# Patient Record
Sex: Female | Born: 1935
Health system: Southern US, Community
[De-identification: ages and names within clinical notes are randomized; demographics above are authoritative.]

## PROBLEM LIST (undated history)

## (undated) DIAGNOSIS — M48061 Spinal stenosis, lumbar region without neurogenic claudication: Secondary | ICD-10-CM

## (undated) DIAGNOSIS — K219 Gastro-esophageal reflux disease without esophagitis: Secondary | ICD-10-CM

## (undated) DIAGNOSIS — D500 Iron deficiency anemia secondary to blood loss (chronic): Secondary | ICD-10-CM

## (undated) DIAGNOSIS — E7800 Pure hypercholesterolemia, unspecified: Secondary | ICD-10-CM

## (undated) DIAGNOSIS — E559 Vitamin D deficiency, unspecified: Secondary | ICD-10-CM

## (undated) DIAGNOSIS — I10 Essential (primary) hypertension: Secondary | ICD-10-CM

## (undated) DIAGNOSIS — R252 Cramp and spasm: Secondary | ICD-10-CM

## (undated) DIAGNOSIS — G510 Bell's palsy: Secondary | ICD-10-CM

## (undated) DIAGNOSIS — H35039 Hypertensive retinopathy, unspecified eye: Secondary | ICD-10-CM

## (undated) DIAGNOSIS — K635 Polyp of colon: Secondary | ICD-10-CM

## (undated) DIAGNOSIS — N189 Chronic kidney disease, unspecified: Secondary | ICD-10-CM

## (undated) HISTORY — PX: APPENDECTOMY: SHX54

## (undated) HISTORY — DX: Spinal stenosis, lumbar region without neurogenic claudication: M48.061

## (undated) HISTORY — DX: Iron deficiency anemia secondary to blood loss (chronic): D50.0

## (undated) HISTORY — DX: Bell's palsy: G51.0

## (undated) HISTORY — PX: ABDOMINAL HYSTERECTOMY: SHX81

## (undated) HISTORY — DX: Vitamin D deficiency, unspecified: E55.9

## (undated) HISTORY — DX: Gastro-esophageal reflux disease without esophagitis: K21.9

## (undated) HISTORY — DX: Chronic kidney disease, unspecified: N18.9

## (undated) HISTORY — DX: Pure hypercholesterolemia, unspecified: E78.00

## (undated) HISTORY — DX: Hypertensive retinopathy, unspecified eye: H35.039

## (undated) HISTORY — DX: Polyp of colon: K63.5

---

## 2015-12-26 DIAGNOSIS — R5381 Other malaise: Secondary | ICD-10-CM | POA: Diagnosis not present

## 2015-12-26 DIAGNOSIS — E559 Vitamin D deficiency, unspecified: Secondary | ICD-10-CM | POA: Diagnosis not present

## 2015-12-26 DIAGNOSIS — E7800 Pure hypercholesterolemia, unspecified: Secondary | ICD-10-CM | POA: Diagnosis not present

## 2015-12-26 DIAGNOSIS — I10 Essential (primary) hypertension: Secondary | ICD-10-CM | POA: Diagnosis not present

## 2016-01-03 DIAGNOSIS — R9431 Abnormal electrocardiogram [ECG] [EKG]: Secondary | ICD-10-CM | POA: Diagnosis not present

## 2016-01-03 DIAGNOSIS — E559 Vitamin D deficiency, unspecified: Secondary | ICD-10-CM | POA: Diagnosis not present

## 2016-01-03 DIAGNOSIS — E7800 Pure hypercholesterolemia, unspecified: Secondary | ICD-10-CM | POA: Diagnosis not present

## 2016-01-03 DIAGNOSIS — K219 Gastro-esophageal reflux disease without esophagitis: Secondary | ICD-10-CM | POA: Diagnosis not present

## 2016-01-03 DIAGNOSIS — Z0000 Encounter for general adult medical examination without abnormal findings: Secondary | ICD-10-CM | POA: Diagnosis not present

## 2016-01-03 DIAGNOSIS — I10 Essential (primary) hypertension: Secondary | ICD-10-CM | POA: Diagnosis not present

## 2016-01-03 DIAGNOSIS — R5381 Other malaise: Secondary | ICD-10-CM | POA: Diagnosis not present

## 2016-01-03 DIAGNOSIS — I447 Left bundle-branch block, unspecified: Secondary | ICD-10-CM | POA: Diagnosis not present

## 2016-01-03 DIAGNOSIS — N6029 Fibroadenosis of unspecified breast: Secondary | ICD-10-CM | POA: Diagnosis not present

## 2016-12-28 DIAGNOSIS — E7800 Pure hypercholesterolemia, unspecified: Secondary | ICD-10-CM | POA: Diagnosis not present

## 2016-12-28 DIAGNOSIS — I10 Essential (primary) hypertension: Secondary | ICD-10-CM | POA: Diagnosis not present

## 2016-12-28 DIAGNOSIS — R5381 Other malaise: Secondary | ICD-10-CM | POA: Diagnosis not present

## 2016-12-28 DIAGNOSIS — E559 Vitamin D deficiency, unspecified: Secondary | ICD-10-CM | POA: Diagnosis not present

## 2017-01-04 DIAGNOSIS — N6029 Fibroadenosis of unspecified breast: Secondary | ICD-10-CM | POA: Diagnosis not present

## 2017-01-04 DIAGNOSIS — E559 Vitamin D deficiency, unspecified: Secondary | ICD-10-CM | POA: Diagnosis not present

## 2017-01-04 DIAGNOSIS — M5416 Radiculopathy, lumbar region: Secondary | ICD-10-CM | POA: Diagnosis not present

## 2017-01-04 DIAGNOSIS — R9431 Abnormal electrocardiogram [ECG] [EKG]: Secondary | ICD-10-CM | POA: Diagnosis not present

## 2017-01-04 DIAGNOSIS — E7800 Pure hypercholesterolemia, unspecified: Secondary | ICD-10-CM | POA: Diagnosis not present

## 2017-01-04 DIAGNOSIS — I10 Essential (primary) hypertension: Secondary | ICD-10-CM | POA: Diagnosis not present

## 2017-01-04 DIAGNOSIS — I447 Left bundle-branch block, unspecified: Secondary | ICD-10-CM | POA: Diagnosis not present

## 2017-01-04 DIAGNOSIS — D649 Anemia, unspecified: Secondary | ICD-10-CM | POA: Diagnosis not present

## 2017-01-04 DIAGNOSIS — Z0000 Encounter for general adult medical examination without abnormal findings: Secondary | ICD-10-CM | POA: Diagnosis not present

## 2017-01-04 DIAGNOSIS — K219 Gastro-esophageal reflux disease without esophagitis: Secondary | ICD-10-CM | POA: Diagnosis not present

## 2017-01-05 DIAGNOSIS — D649 Anemia, unspecified: Secondary | ICD-10-CM | POA: Diagnosis not present

## 2017-01-23 ENCOUNTER — Other Ambulatory Visit: Payer: Self-pay | Admitting: Internal Medicine

## 2017-01-23 DIAGNOSIS — M5416 Radiculopathy, lumbar region: Secondary | ICD-10-CM

## 2017-02-05 ENCOUNTER — Ambulatory Visit
Admission: RE | Admit: 2017-02-05 | Discharge: 2017-02-05 | Disposition: A | Discharge status: Home / Self Care | Payer: Medicare Other | Source: Ambulatory Visit | Attending: Internal Medicine | Admitting: Internal Medicine

## 2017-02-05 DIAGNOSIS — M5416 Radiculopathy, lumbar region: Secondary | ICD-10-CM

## 2017-02-05 DIAGNOSIS — M48061 Spinal stenosis, lumbar region without neurogenic claudication: Secondary | ICD-10-CM | POA: Diagnosis not present

## 2017-02-07 ENCOUNTER — Emergency Department (HOSPITAL_BASED_OUTPATIENT_CLINIC_OR_DEPARTMENT_OTHER): Payer: Medicare Other

## 2017-02-07 ENCOUNTER — Emergency Department (HOSPITAL_BASED_OUTPATIENT_CLINIC_OR_DEPARTMENT_OTHER)
Admission: EM | Admit: 2017-02-07 | Discharge: 2017-02-07 | Disposition: A | Discharge status: Home / Self Care | Payer: Medicare Other | Attending: Emergency Medicine | Admitting: Emergency Medicine

## 2017-02-07 ENCOUNTER — Encounter (HOSPITAL_BASED_OUTPATIENT_CLINIC_OR_DEPARTMENT_OTHER): Payer: Self-pay | Admitting: Emergency Medicine

## 2017-02-07 DIAGNOSIS — I10 Essential (primary) hypertension: Secondary | ICD-10-CM | POA: Diagnosis not present

## 2017-02-07 DIAGNOSIS — Z79899 Other long term (current) drug therapy: Secondary | ICD-10-CM | POA: Insufficient documentation

## 2017-02-07 DIAGNOSIS — G510 Bell's palsy: Secondary | ICD-10-CM | POA: Diagnosis not present

## 2017-02-07 DIAGNOSIS — R29810 Facial weakness: Secondary | ICD-10-CM | POA: Diagnosis present

## 2017-02-07 DIAGNOSIS — R200 Anesthesia of skin: Secondary | ICD-10-CM | POA: Diagnosis not present

## 2017-02-07 HISTORY — DX: Cramp and spasm: R25.2

## 2017-02-07 HISTORY — DX: Essential (primary) hypertension: I10

## 2017-02-07 HISTORY — DX: Pure hypercholesterolemia, unspecified: E78.00

## 2017-02-07 LAB — CBC WITH DIFFERENTIAL/PLATELET
BASOS ABS: 0.1 10*3/uL (ref 0.0–0.1)
BASOS PCT: 1 %
EOS PCT: 2 %
Eosinophils Absolute: 0.1 10*3/uL (ref 0.0–0.7)
HCT: 35.6 % — ABNORMAL LOW (ref 36.0–46.0)
Hemoglobin: 11.9 g/dL — ABNORMAL LOW (ref 12.0–15.0)
Lymphocytes Relative: 19 %
Lymphs Abs: 1.3 10*3/uL (ref 0.7–4.0)
MCH: 30.7 pg (ref 26.0–34.0)
MCHC: 33.4 g/dL (ref 30.0–36.0)
MCV: 92 fL (ref 78.0–100.0)
Monocytes Absolute: 0.6 10*3/uL (ref 0.1–1.0)
Monocytes Relative: 8 %
Neutro Abs: 4.8 10*3/uL (ref 1.7–7.7)
Neutrophils Relative %: 70 %
PLATELETS: 305 10*3/uL (ref 150–400)
RBC: 3.87 MIL/uL (ref 3.87–5.11)
RDW: 12.2 % (ref 11.5–15.5)
WBC: 6.8 10*3/uL (ref 4.0–10.5)

## 2017-02-07 LAB — BASIC METABOLIC PANEL
Anion gap: 11 (ref 5–15)
BUN: 23 mg/dL — AB (ref 6–20)
CALCIUM: 9.1 mg/dL (ref 8.9–10.3)
CO2: 23 mmol/L (ref 22–32)
Chloride: 101 mmol/L (ref 101–111)
Creatinine, Ser: 1.01 mg/dL — ABNORMAL HIGH (ref 0.44–1.00)
GFR calc Af Amer: 60 mL/min — ABNORMAL LOW (ref >60)
GFR, EST NON AFRICAN AMERICAN: 52 mL/min — AB (ref >60)
Glucose, Bld: 103 mg/dL — ABNORMAL HIGH (ref 65–99)
Potassium: 4 mmol/L (ref 3.5–5.1)
Sodium: 135 mmol/L (ref 135–145)

## 2017-02-07 MED ORDER — VALACYCLOVIR HCL 1 G PO TABS: 1000.0000 mg | ORAL_TABLET | Freq: Three times a day (TID) | ORAL | 0 refills | Status: DC

## 2017-02-07 MED ORDER — PREDNISONE 10 MG PO TABS: 20.0000 mg | ORAL_TABLET | Freq: Two times a day (BID) | ORAL | 0 refills | Status: DC

## 2017-02-07 MED FILL — valACYclovir HCL 1 GM TABS: 1 | 7 days supply | Qty: 21 | Fill #0

## 2017-02-07 MED FILL — predniSONE 10 MG TABS: 10 | 5 days supply | Qty: 20 | Fill #0

## 2017-02-07 NOTE — ED Provider Notes (Signed)
[facility] Provider Note   CSN: [id number] Arrival date & time: 02/07/17  1219     History   Chief Complaint Chief Complaint  Patient presents with  . Facial Droop    HPI Claire Jensen is a 82 y.o. female.  Patient is a 82 year old female with past medical history of hypertension. She presents today for evaluation of right facial weakness and numbness that started sometime last night. It was present upon waking sleep this morning. She reports trying to brush her teeth and liquids bowing out of the corner mouth. She is also having trouble closing her eye. She denies any weakness or numbness of her arm or leg.   The history is provided by the patient.  Weakness  Primary symptoms include focal weakness. This is a new problem. Episode onset: Last night. The problem has been gradually improving. There was right facial focality noted. There has been no fever. Pertinent negatives include no shortness of breath, no chest pain and no vomiting.    Past Medical History:  Diagnosis Date  . Hypercholesteremia   . Hypertension   . Leg cramps     There are no active problems to display for this patient.   Past Surgical History:  Procedure Laterality Date  . ABDOMINAL HYSTERECTOMY    . APPENDECTOMY      OB History    No data available       Home Medications    Prior to Admission medications   Medication Sig Start Date End Date Taking? Authorizing Provider  atorvastatin (LIPITOR) 20 MG tablet Take 20 mg by mouth daily.   Yes [provider]  lisinopril (PRINIVIL,ZESTRIL) 10 MG tablet Take 10 mg by mouth daily.   Yes [provider]  magnesium 30 MG tablet Take 3 mg by mouth 2 (two) times daily.   Yes [provider]    Family History History reviewed. No pertinent family history.  Social History Social History  Substance Use Topics  . Smoking status: Never Smoker  . Smokeless tobacco: Never Used  . Alcohol use 0.6 oz/week    1  Shots of liquor per week     Allergies   Patient has no known allergies.   Review of Systems Review of Systems  Respiratory: Negative for shortness of breath.   Cardiovascular: Negative for chest pain.  Gastrointestinal: Negative for vomiting.  Neurological: Positive for focal weakness and weakness.  All other systems reviewed and are negative.    Physical Exam Updated Vital Signs BP (!) 189/88 (BP Location: Right Arm)   Pulse 99   Temp 98 F (36.7 C) (Oral)   Resp 18   Ht 5\' 2"  (1.575 m)   Wt 74.4 kg (164 lb)   SpO2 99%   BMI 30.00 kg/m   Physical Exam  Constitutional: She is oriented to person, place, and time. She appears well-developed and well-nourished. No distress.  HENT:  Head: Normocephalic and atraumatic.  Mouth/Throat: Oropharynx is clear and moist.  Neck: Normal range of motion. Neck supple.  Cardiovascular: Normal rate and regular rhythm.  Exam reveals no gallop and no friction rub.   No murmur heard. Pulmonary/Chest: Effort normal and breath sounds normal. No respiratory distress. She has no wheezes.  Abdominal: Soft. Bowel sounds are normal. She exhibits no distension. There is no tenderness.  Musculoskeletal: Normal range of motion.  Neurological: She is alert and oriented to person, place, and time. A cranial nerve deficit is present. She exhibits normal muscle tone. Coordination  normal.  There is a seventh cranial nerve palsy present. There is facial droop with temporal involvement and asymmetrical closing of the eyelids.  Skin: Skin is warm and dry. She is not diaphoretic.  Nursing note and vitals reviewed.    ED Treatments / Results  Labs (all labs ordered are listed, but only abnormal results are displayed) Labs Reviewed  BASIC METABOLIC PANEL  CBC WITH DIFFERENTIAL/PLATELET    EKG  EKG Interpretation  Date/Time:  Thursday Feb 07 2017 12:36:20 EDT Ventricular Rate:  87 PR Interval:    QRS Duration: 140 QT Interval:  382 QTC  Calculation: 460 R Axis:   -6 Text Interpretation:  Sinus rhythm Left bundle branch block Confirmed by Geoffery Lyons ([id number]) on 02/07/2017 1:44:34 PM       Radiology No results found.  Procedures Procedures (including critical care time)  Medications Ordered in ED Medications - No data to display   Initial Impression / Assessment and Plan / ED Course  I have reviewed the triage vital signs and the nursing notes.  Pertinent labs & imaging results that were available during my care of the patient were reviewed by me and considered in my medical decision making (see chart for details).  Patient presents with right-sided facial droop. Her exam and workup is most consistent with a Bell's palsy. There is involvement of the eyelid and musculature of the forehead. CT scan is negative and laboratory studies are normal. She will be discharged with prednisone, Valtrex, and follow-up with primary doctor.  Final Clinical Impressions(s) / ED Diagnoses   Final diagnoses:  None    New Prescriptions New Prescriptions   No medications on file     Geoffery Lyons, MD 02/07/17 1407

## 2017-02-07 NOTE — Discharge Instructions (Signed)
Prednisone and Valtrex as prescribed.  Follow-up with your primary Dr. in the next week for recheck, and return to the emergency department in the meantime if symptoms significantly worsen or change.

## 2017-02-07 NOTE — ED Triage Notes (Signed)
Patient states that she woke up this am and her right eye was draing and hard to close. The patient reports that is also having some numbness to her tongue. Denies any other complications. The patient has a noted facial droop

## 2017-02-07 NOTE — ED Notes (Signed)
Patient transported to CT 

## 2017-03-08 DIAGNOSIS — D649 Anemia, unspecified: Secondary | ICD-10-CM | POA: Diagnosis not present

## 2017-03-08 DIAGNOSIS — G510 Bell's palsy: Secondary | ICD-10-CM | POA: Diagnosis not present

## 2017-03-08 DIAGNOSIS — N6029 Fibroadenosis of unspecified breast: Secondary | ICD-10-CM | POA: Diagnosis not present

## 2017-03-08 DIAGNOSIS — M48062 Spinal stenosis, lumbar region with neurogenic claudication: Secondary | ICD-10-CM | POA: Diagnosis not present

## 2017-03-08 DIAGNOSIS — E559 Vitamin D deficiency, unspecified: Secondary | ICD-10-CM | POA: Diagnosis not present

## 2017-03-08 DIAGNOSIS — I447 Left bundle-branch block, unspecified: Secondary | ICD-10-CM | POA: Diagnosis not present

## 2017-03-08 DIAGNOSIS — K219 Gastro-esophageal reflux disease without esophagitis: Secondary | ICD-10-CM | POA: Diagnosis not present

## 2017-03-08 DIAGNOSIS — Z23 Encounter for immunization: Secondary | ICD-10-CM | POA: Diagnosis not present

## 2017-03-08 DIAGNOSIS — I10 Essential (primary) hypertension: Secondary | ICD-10-CM | POA: Diagnosis not present

## 2017-03-08 DIAGNOSIS — E7800 Pure hypercholesterolemia, unspecified: Secondary | ICD-10-CM | POA: Diagnosis not present

## 2017-03-12 ENCOUNTER — Encounter: Payer: Self-pay | Admitting: Neurology

## 2017-03-12 DIAGNOSIS — G510 Bell's palsy: Secondary | ICD-10-CM | POA: Diagnosis not present

## 2017-03-12 DIAGNOSIS — I10 Essential (primary) hypertension: Secondary | ICD-10-CM | POA: Diagnosis not present

## 2017-03-12 DIAGNOSIS — I447 Left bundle-branch block, unspecified: Secondary | ICD-10-CM | POA: Diagnosis not present

## 2017-03-12 DIAGNOSIS — Z79899 Other long term (current) drug therapy: Secondary | ICD-10-CM | POA: Diagnosis not present

## 2017-03-12 DIAGNOSIS — M48061 Spinal stenosis, lumbar region without neurogenic claudication: Secondary | ICD-10-CM | POA: Diagnosis not present

## 2017-03-12 DIAGNOSIS — E7800 Pure hypercholesterolemia, unspecified: Secondary | ICD-10-CM | POA: Diagnosis not present

## 2017-03-12 DIAGNOSIS — D649 Anemia, unspecified: Secondary | ICD-10-CM | POA: Diagnosis not present

## 2017-03-14 DIAGNOSIS — G510 Bell's palsy: Secondary | ICD-10-CM | POA: Diagnosis not present

## 2017-03-14 DIAGNOSIS — H18891 Other specified disorders of cornea, right eye: Secondary | ICD-10-CM | POA: Diagnosis not present

## 2017-03-14 DIAGNOSIS — H02203 Unspecified lagophthalmos right eye, unspecified eyelid: Secondary | ICD-10-CM | POA: Diagnosis not present

## 2017-03-28 DIAGNOSIS — H18891 Other specified disorders of cornea, right eye: Secondary | ICD-10-CM | POA: Diagnosis not present

## 2017-03-28 DIAGNOSIS — G510 Bell's palsy: Secondary | ICD-10-CM | POA: Diagnosis not present

## 2017-03-28 DIAGNOSIS — H02203 Unspecified lagophthalmos right eye, unspecified eyelid: Secondary | ICD-10-CM | POA: Diagnosis not present

## 2017-04-26 ENCOUNTER — Ambulatory Visit: Payer: Medicare Other | Admitting: Neurology

## 2017-05-08 ENCOUNTER — Ambulatory Visit (INDEPENDENT_AMBULATORY_CARE_PROVIDER_SITE_OTHER): Payer: Medicare Other | Admitting: Neurology

## 2017-05-08 ENCOUNTER — Encounter: Payer: Self-pay | Admitting: Neurology

## 2017-05-08 VITALS — BP 150/82 | HR 88 | Ht 62.0 in | Wt 167.0 lb

## 2017-05-08 DIAGNOSIS — G629 Polyneuropathy, unspecified: Secondary | ICD-10-CM | POA: Diagnosis not present

## 2017-05-08 DIAGNOSIS — M48061 Spinal stenosis, lumbar region without neurogenic claudication: Secondary | ICD-10-CM | POA: Diagnosis not present

## 2017-05-08 DIAGNOSIS — R2681 Unsteadiness on feet: Secondary | ICD-10-CM | POA: Diagnosis not present

## 2017-05-08 DIAGNOSIS — G510 Bell's palsy: Secondary | ICD-10-CM

## 2017-05-08 NOTE — Progress Notes (Signed)
NEUROLOGY CONSULTATION NOTE  Claire Jensen MRN: [id number] DOB: 1935-06-01  Referring provider: Dr. Raechel Chute Primary care provider: Dr. Raechel Chute  Reason for consult:  Right Bell's palsy  Dear Dr Constance Goltz:  Thank you for your kind referral of Claire Jensen for consultation of the above symptoms. Although her history is well known to you, please allow me to reiterate it for the purpose of our medical record. Records and images were personally reviewed where available.  HISTORY OF PRESENT ILLNESS: This is a very pleasant 82 year old right-handed woman with a history of hypertension, hyperlipidemia, back pain, presenting for evaluation of right-sided Bell's palsy. She woke up on 01/20/17 and felt the right side of tongue was numb and became progressively numb. She noticed drooping around her right eye and mouth and thought she had a stroke so she went to [facility] ER where she was diagnosed with bell's palsy. She had a head CT without contrast which I personally reviewed, no acute changes. She was discharged home on a course of Prednisone and Valtrex. She reports this is the 13th week but symptoms still continue. She does note that the weakness has gotten better, she still has numbness on the right side of her tongue and problems drinking from a cup, but can now close her eye better. She has seen ophthalmologist Dr. Alben Spittle and uses Systane eye drops, but still feels like there is an eye lash over her right eye. She recalls having right-sided neck pain and pain behind the right ear 2 days before facial weakness onset. She denies any recent infections. No headaches, dizziness, diplopia, dysarthria/dysphagia, focal numbness/tingling/weakness, no facial paresthesias or pain. She denies any prior history of Bell's palsy, no family history. She has chronic back pain and takes multiple doses of Advil and Excedrin on a daily basis. She had been going back and forth to [place] where she sees her  physician, and brings a copy of her MRI lumbar spine done 02/05/17 which reported diffuse advanced disc and facet disease, with L1-2 moderate right foraminal impingement, L2-3 advanced spinal stenosis and left foraminal impingement, L3-4 left foraminal impingement, L4-5 advanced spinal stenosis. She notes imbalance for many years, no falls. She had constipation, which improved after magnesium for back pain was started.   Laboratory Data: CBC, CMP, TSH were unremarkable. B12 was 359.  PAST MEDICAL HISTORY: Past Medical History:  Diagnosis Date  . Hypercholesteremia   . Hypertension   . Leg cramps     PAST SURGICAL HISTORY: Past Surgical History:  Procedure Laterality Date  . ABDOMINAL HYSTERECTOMY    . APPENDECTOMY      MEDICATIONS: Current Outpatient Prescriptions on File Prior to Visit  Medication Sig Dispense Refill  . atorvastatin (LIPITOR) 20 MG tablet Take 20 mg by mouth daily.    Marland Kitchen lisinopril (PRINIVIL,ZESTRIL) 10 MG tablet Take 10 mg by mouth daily.    . magnesium 30 MG tablet Take 3 mg by mouth 2 (two) times daily.     No current facility-administered medications on file prior to visit.     ALLERGIES: No Known Allergies  FAMILY HISTORY: No family history on file.  SOCIAL HISTORY: Social History   Social History  . Marital status: Single    Spouse name: N/A  . Number of children: N/A  . Years of education: N/A   Occupational History  . Not on file.   Social History Main Topics  . Smoking status: Never Smoker  . Smokeless tobacco: Never Used  . Alcohol  use 0.6 oz/week    1 Shots of liquor per week  . Drug use: No  . Sexual activity: Not on file   Other Topics Concern  . Not on file   Social History Narrative  . No narrative on file    REVIEW OF SYSTEMS: Constitutional: No fevers, chills, or sweats, no generalized fatigue, change in appetite Eyes: No visual changes, double vision, eye pain Ear, nose and throat: No hearing loss, ear pain, nasal  congestion, sore throat Cardiovascular: No chest pain, palpitations Respiratory:  No shortness of breath at rest or with exertion, wheezes GastrointestinaI: No nausea, vomiting, diarrhea, abdominal pain, fecal incontinence Genitourinary:  No dysuria, urinary retention or frequency Musculoskeletal:  + neck pain, back pain Integumentary: No rash, pruritus, skin lesions Neurological: as above Psychiatric: No depression, insomnia, anxiety Endocrine: No palpitations, fatigue, diaphoresis, mood swings, change in appetite, change in weight, increased thirst Hematologic/Lymphatic:  No anemia, purpura, petechiae. Allergic/Immunologic: no itchy/runny eyes, nasal congestion, recent allergic reactions, rashes  PHYSICAL EXAM: Vitals:   05/08/17 1026  BP: (!) 150/82  Pulse: 88  SpO2: 98%   General: No acute distress HEENT: Normocephalic/atraumatic, no vesicles in ear canals Eyes: Fundoscopic exam shows bilateral sharp discs, no vessel changes, exudates, or hemorrhages Neck: supple, no paraspinal tenderness, full range of motion Back: No paraspinal tenderness Heart: regular rate and rhythm Lungs: Clear to auscultation bilaterally. Vascular: No carotid bruits. Skin/Extremities: No rash, no edema Neurological Exam: Mental status: alert and oriented to person, place, and time, no dysarthria or aphasia, Fund of knowledge is appropriate.  Recent and remote memory are intact.  Attention and concentration are normal.    Able to name objects and repeat phrases. Cranial nerves: CN I: not tested CN II: pupils equal, round and reactive to light, visual fields intact, fundi unremarkable. CN III, IV, VI:  full range of motion, no nystagmus, no ptosis CN V: facial sensation intact CN VII: weakness of right frontalis, orbicularis oculi and oris, House-Brackmann grade III moderate dysfunction with slight to moderate forehead movement, eye closure with effort, mouth weakness with maximal effort. CN VIII:  hearing intact to finger rub CN IX, X: gag intact, uvula midline CN XI: sternocleidomastoid and trapezius muscles intact CN XII: tongue midline Bulk & Tone: normal, no fasciculations. Motor: 5/5 throughout with no pronator drift. Sensation: intact to light touch, cold, pin on both UE, decreased cold on both feet, decreased vibration to ankles bilaterally, intact pin and joint position sense.  No extinction to double simultaneous stimulation.  Romberg test slight sway Deep Tendon Reflexes: +1 throughout except for absent ankle clonus, no ankle clonus, negative Hoffman sign Plantar responses: downgoing bilaterally Cerebellar: no incoordination on finger to nose testing Gait: narrow-based and steady, mild difficulty with tandem walk but able Tremor: none  IMPRESSION: This is a very pleasant 82 year old right-handed woman with a history of hypertension, hyperlipidemia, back pain, presenting for evaluation of right-sided Bell's palsy that started on 02/07/17. She has noticed some improvement in the facial weakness, but is concerned it is not back to normal in 13 weeks. We discussed typical prognosis with Bell's palsy, depending on initial severity of symptoms, some patients may not have complete recovery, it is still too early to tell at this point. She received steroid and antiviral treatment at onset. She will try doing facial exercises. The importance of eye care was discussed, she was advised to wear an eye patch at night and continue follow-up with Dr. Alben SpittleWeaver. She also expressed concern  about gait imbalance, likely multifactorial from spinal stenosis and mild neuropathy. She will be referred to PT for spinal stenosis/back pain and balance therapy, as well as Ortho for spinal stenosis and back pain. She will follow-up in 4-5 months and knows to call for any changes.   Thank you for allowing me to participate in the care of this patient. Please do not hesitate to call for any questions or  concerns.   Patrcia Dolly, M.D.  CC: Dr. Constance Goltz

## 2017-05-08 NOTE — Patient Instructions (Addendum)
1. A referral to Orthopedics will be sent for the back pain and spinal stenosis  You have been referred to [facility].  They are located at [address], [place], [place] [address] They will call you to schedule your appointment.  If for any reason you need to contact them yourself, their phone number is [phone]   2. A referral to Physical therapy for back pain, spinal stenosis, and gait imbalance will be sent  You have been referred to [facility].  You will be hearing from them to schedule your appointment.   3. Follow-up with Dr. Alben SpittleWeaver as scheduled, start wearing eye patch at night 4. Follow-up in 4-5 months, call for any changes

## 2017-05-09 DIAGNOSIS — G510 Bell's palsy: Secondary | ICD-10-CM | POA: Diagnosis not present

## 2017-05-09 DIAGNOSIS — H18891 Other specified disorders of cornea, right eye: Secondary | ICD-10-CM | POA: Diagnosis not present

## 2017-05-14 DIAGNOSIS — M48061 Spinal stenosis, lumbar region without neurogenic claudication: Secondary | ICD-10-CM | POA: Diagnosis not present

## 2017-05-14 DIAGNOSIS — G510 Bell's palsy: Secondary | ICD-10-CM | POA: Diagnosis not present

## 2017-05-14 DIAGNOSIS — I10 Essential (primary) hypertension: Secondary | ICD-10-CM | POA: Diagnosis not present

## 2017-05-14 DIAGNOSIS — D508 Other iron deficiency anemias: Secondary | ICD-10-CM | POA: Diagnosis not present

## 2017-05-14 DIAGNOSIS — H189 Unspecified disorder of cornea: Secondary | ICD-10-CM | POA: Diagnosis not present

## 2017-05-22 DIAGNOSIS — I10 Essential (primary) hypertension: Secondary | ICD-10-CM | POA: Diagnosis not present

## 2017-06-03 IMAGING — MR MR LUMBAR SPINE W/O CM
4 of 5 series · 19 of 48 positions shown · non-contrast
Comparison: None.

CLINICAL DATA: Low back pain extending down the left leg.

EXAM:
MRI LUMBAR SPINE WITHOUT CONTRAST
TECHNIQUE: Multiplanar, multisequence MR imaging of the lumbar spine was
performed. No intravenous contrast was administered.

[Series 6: T2 · sagittal · 4.0mm · 0.73mm/px · 7 of 15 slices shown (1 of 2)]
[im 1/15]
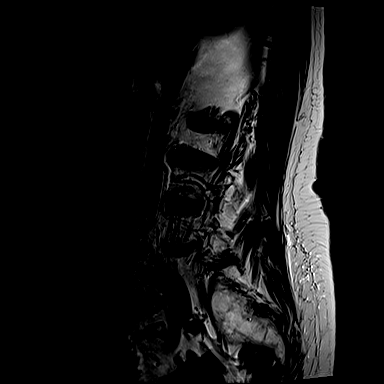
[im 3/15]
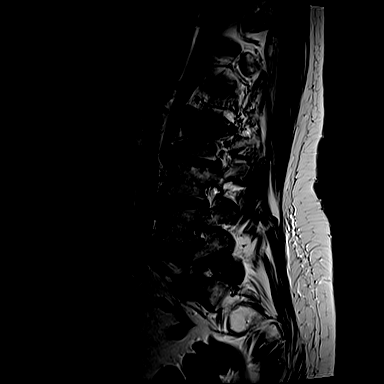
[im 5/15]
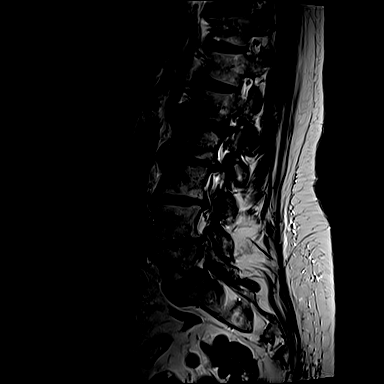
[im 8/15]
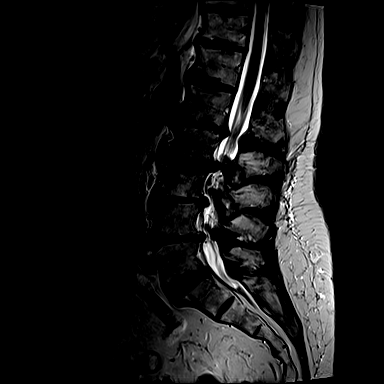
[im 10/15]
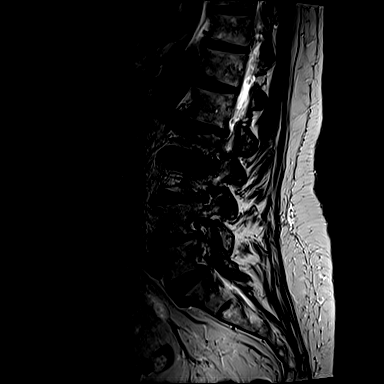
[im 12/15]
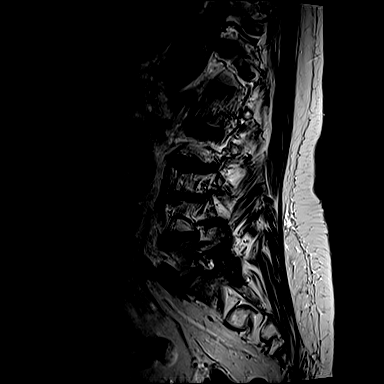
[im 15/15]
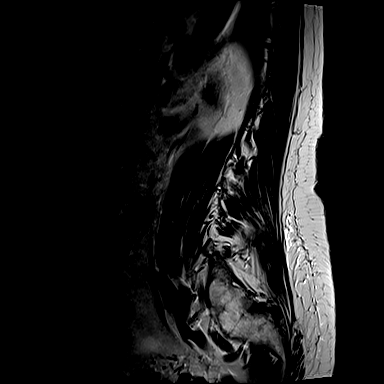

[Series 7: T1 · sagittal · 4.0mm · 0.73mm/px · 3 of 15 slices shown (1 of 2)]
[im 3/15]
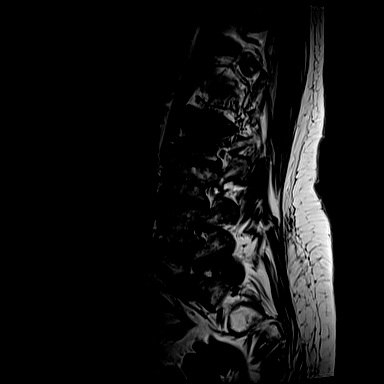
[im 8/15]
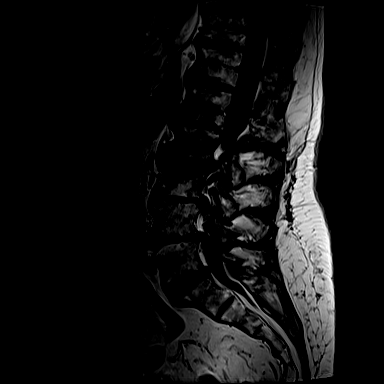
[im 12/15]
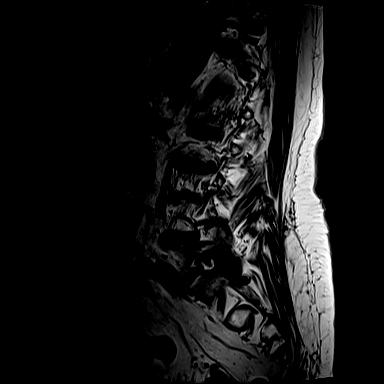

[Series 11: T1 · axial · 4.0mm · 0.28mm/px · z∈[-42,+98]mm · 3 of 33 slices shown (2 of 2)]
[im 5/33]
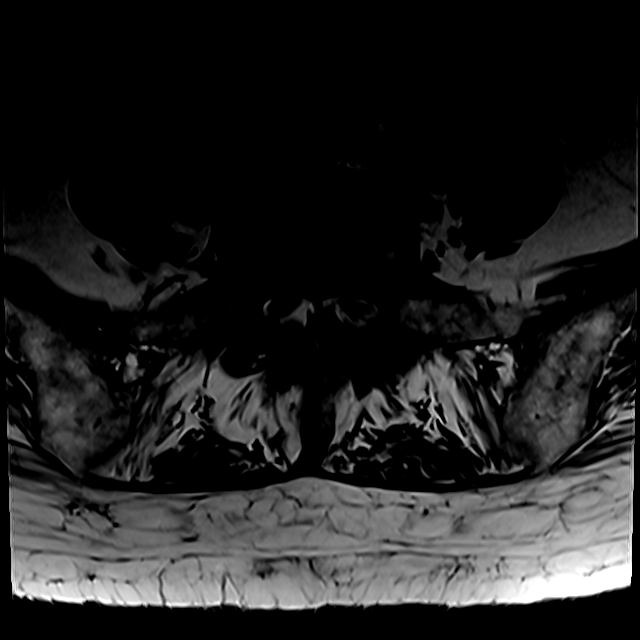
[im 18/33]
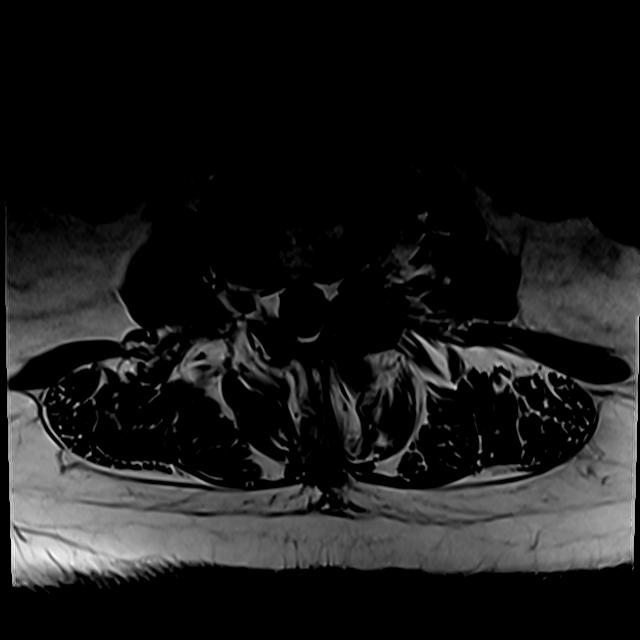
[im 28/33]
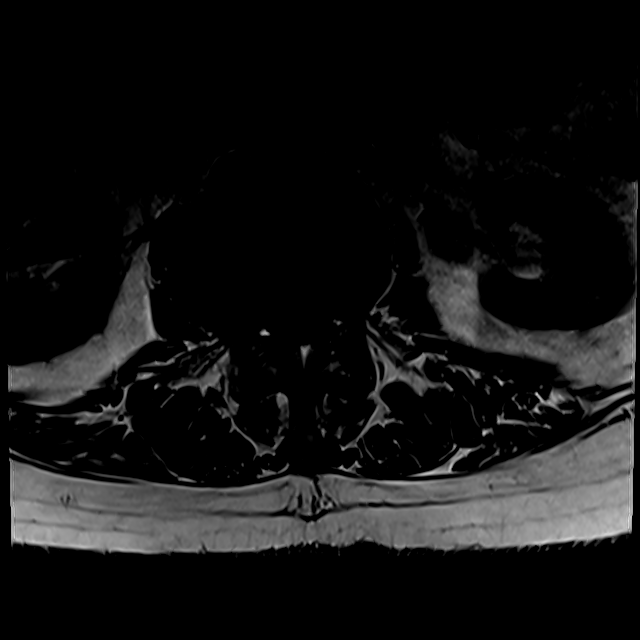

[Series 14: T2 · axial · 4.0mm · 0.28mm/px · z∈[-62,+98]mm · 6 of 33 slices shown (2 of 2)]
[im 1/33]
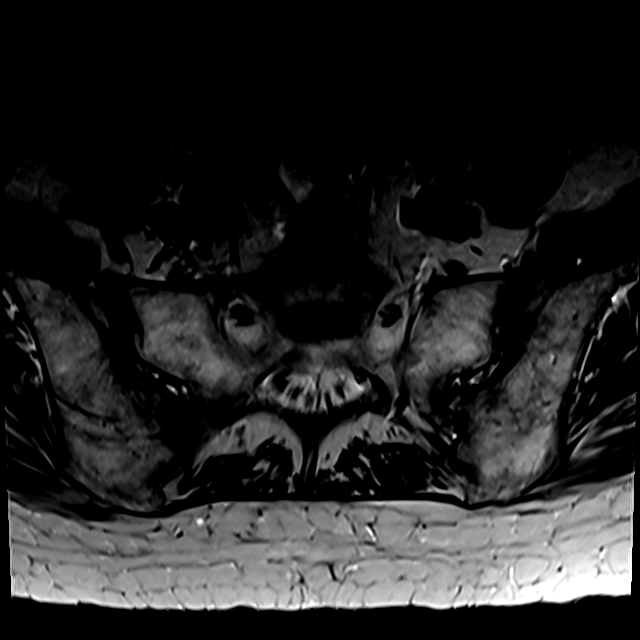
[im 5/33]
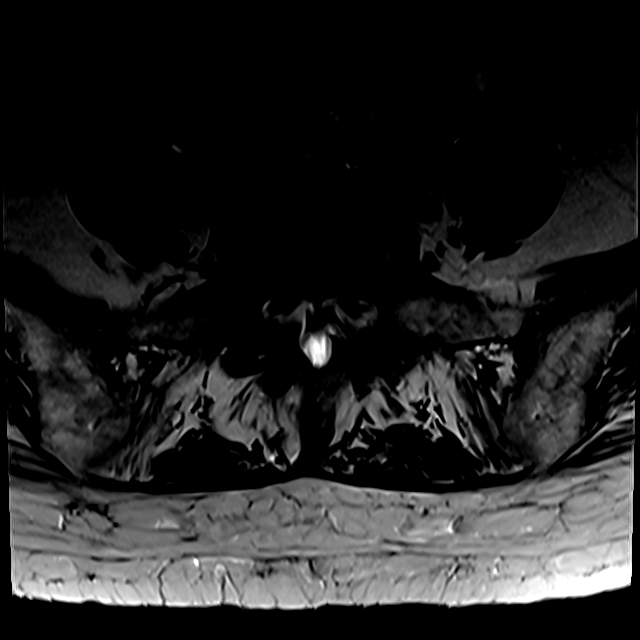
[im 10/33]
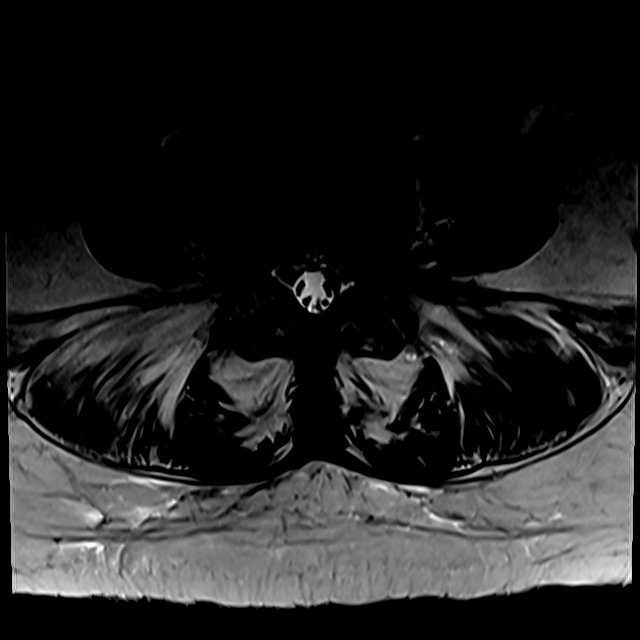
[im 15/33]
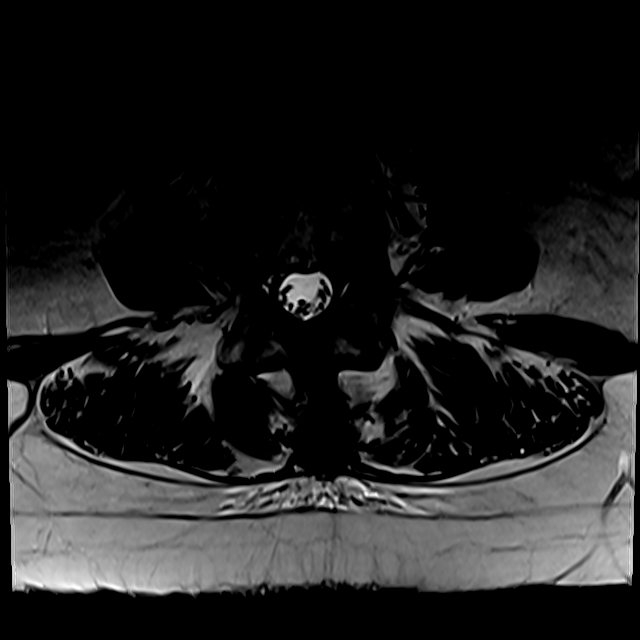
[im 18/33]
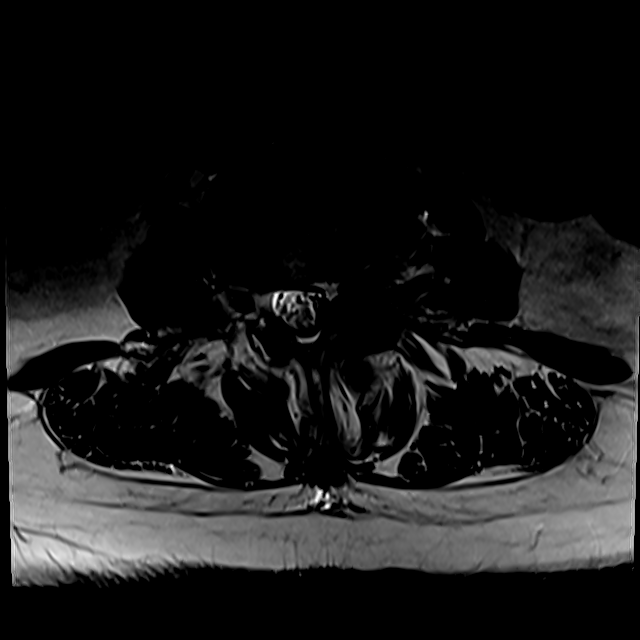
[im 28/33]
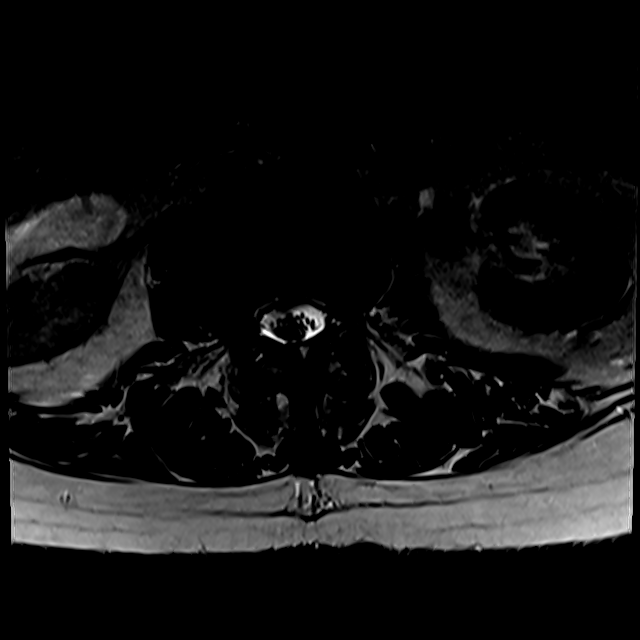

[19 of 48 positions shown; findings below may reference images not displayed]

FINDINGS: Segmentation:  Standard.

Alignment:  Negative for listhesis.

Vertebrae: Degenerative alteration of marrow (Predominately
sclerosis) around the right aspect of the L1-2 and left aspect of
the L2-3 disc spaces. Limbus vertebra at L5. No acute fracture,
discitis, or aggressive bone lesion.

Conus medullaris: Extends to the L1 level and appears normal.

Paraspinal and other soft tissues: Fatty atrophy of intrinsic back
muscles. Colonic diverticulosis with hypertrophic appearing sigmoid

Disc levels:

T12- L1: Unremarkable.

L1-L2: Advanced asymmetric right disc narrowing and endplate
spurring. Asymmetric right facet arthropathy. Ligamentum flavum
thickening. Mild spinal stenosis. Moderate right foraminal
narrowing.

L2-L3: Advanced asymmetric left disc narrowing and endplate
spurring. Facet arthropathy with bony and ligamentous hypertrophy.
Spinal stenosis with complete effacement of CSF and inferior
redundant nerve roots. High-grade left foraminal narrowing with L2
impingement

L3-L4: Advanced asymmetric left disc narrowing and endplate ridging.
Hypertrophic facet arthropathy and ligamentum flavum thickening
greater on the left. Mild spinal stenosis with noncompressive left
subarticular recess narrowing. Left foraminal impingement on L3.

L4-L5: Advanced disc narrowing with disc bulging and endplate
ridging. Facet and ligament hypertrophy. Advanced spinal stenosis
with near complete effacement of CSF. Foraminal narrowing without L4
compression.

L5-S1:Bilateral facet arthropathy with anterolisthesis and mild to
moderate bilateral foraminal narrowing.
IMPRESSION: 1. Diffuse advanced disc and facet degeneration as described.
2. L1-2 moderate right foraminal impingement.
3. L2-3 advanced spinal stenosis and left foraminal impingement.
4. L3-4 left foraminal impingement.
5. L4-5 advanced spinal stenosis.

## 2017-06-21 ENCOUNTER — Ambulatory Visit (INDEPENDENT_AMBULATORY_CARE_PROVIDER_SITE_OTHER): Payer: Medicare Other | Admitting: Orthopaedic Surgery

## 2017-06-21 ENCOUNTER — Encounter (INDEPENDENT_AMBULATORY_CARE_PROVIDER_SITE_OTHER): Payer: Self-pay | Admitting: Orthopaedic Surgery

## 2017-06-21 VITALS — BP 148/79 | HR 81 | Ht 62.0 in | Wt 164.0 lb

## 2017-06-21 DIAGNOSIS — M48061 Spinal stenosis, lumbar region without neurogenic claudication: Secondary | ICD-10-CM | POA: Diagnosis not present

## 2017-06-21 NOTE — Progress Notes (Signed)
Office Visit Note   Patient: Claire Jensen           Date of Birth: May 26, 1935           MRN: [id number] Visit Date: 06/21/2017              Requested by: Kendrick Ranch, MD [address] [place], [place] [address] PCP: Kendrick Ranch, MD   Assessment & Plan: Visit Diagnoses:  1. Spinal stenosis of lumbar region without neurogenic claudication     Plan: Patient has spinal stenosis and foraminal stenosis lumbar spine. She's better with Advil and Tylenol. We discussed symptoms as well as discuss possible epidural injection on the left at L2-3 and L3-4. Currently she is doing well just on over-the-counter medications and would like to defer injection. She can return in 3 months for recheck. We reviewed the MRI scan reviewed images as well as the report. We discussed possible surgical options if her symptoms progress. We discussed maximum dosages of Tylenol as well as over-the-counter Advil and recommendations for short-term use of Advil. Recheck 3 months.  Follow-Up Instructions: Return in about 3 months (around 09/21/2017).   Orders:  No orders of the defined types were placed in this encounter.  No orders of the defined types were placed in this encounter.     Procedures: No procedures performed   Clinical Data: No additional findings.   Subjective: Chief Complaint  Patient presents with  . Lower Back - Pain    HPI 82 year old female seen with complaints of chronic back pain 10 years. She states she's had some recent increase in left upper thigh pain. She is to do a lot of walking states she cannot walk as much as she did previously. She's tried 2 Advil in the morning and 2 Tylenol Extra Strength 4 hours later and states that this is taking care of her pain. She is also a recent Bell's palsy right side of her face which is improving. MRI scan lumbar 02/05/2017 showed advanced spinal stenosis and left foraminal impingement at L2-3 as well as left  foraminal impingement at L3-4. L4-5 had advanced spinal stenosis with effacement of CSF.  Review of Systems 14 point review of systems positive for  acid reflux patient, she  works as an Airline pilot, previous appendectomy 1943. She takes lisinopril for hypertension and lovastatin for hypercholesterolemia. Community ambulator. No associated bowel bladder symptoms no fever chills.   Objective: Vital Signs: BP (!) 148/79   Pulse 81   Ht  (1.575 m)   Wt 164 lb (74.4 kg)   BMI 30.00 kg/m   Physical Exam  Constitutional: She is oriented to person, place, and time. She appears well-developed.  HENT:  Head: Normocephalic.  Right Ear: External ear normal.  Left Ear: External ear normal.  Eyes: Pupils are equal, round, and reactive to light.  Neck: No tracheal deviation present. No thyromegaly present.  Cardiovascular: Normal rate.   Pulmonary/Chest: Effort normal.  Abdominal: Soft.  Neurological: She is alert and oriented to person, place, and time.  Skin: Skin is warm and dry.  Psychiatric: She has a normal mood and affect. Her behavior is normal.    Ortho Exam patient is ambulatory with a normal gait. Good quad and adductor strength. Anterior tib EHL is strong. Distal pulses are intact no pain with hip range of motion. Mild tenderness palpation lumbar spine.  Specialty Comments:  No specialty comments available.  Imaging: CONTRAST  Study Result   CLINICAL  DATA:  Low back pain extending down the left leg.  EXAM: MRI LUMBAR SPINE WITHOUT CONTRAST  TECHNIQUE: Multiplanar, multisequence MR imaging of the lumbar spine was performed. No intravenous contrast was administered.  COMPARISON:  None.  FINDINGS: Segmentation:  Standard.  Alignment:  Negative for listhesis.  Vertebrae: Degenerative alteration of marrow (Predominately sclerosis) around the right aspect of the L1-2 and left aspect of the L2-3 disc spaces. Limbus vertebra at L5. No acute fracture, discitis,  or aggressive bone lesion.  Conus medullaris: Extends to the L1 level and appears normal.  Paraspinal and other soft tissues: Fatty atrophy of intrinsic back muscles. Colonic diverticulosis with hypertrophic appearing sigmoid  Disc levels:  T12- L1: Unremarkable.  L1-L2: Advanced asymmetric right disc narrowing and endplate spurring. Asymmetric right facet arthropathy. Ligamentum flavum thickening. Mild spinal stenosis. Moderate right foraminal narrowing.  L2-L3: Advanced asymmetric left disc narrowing and endplate spurring. Facet arthropathy with bony and ligamentous hypertrophy. Spinal stenosis with complete effacement of CSF and inferior redundant nerve roots. High-grade left foraminal narrowing with L2 impingement  L3-L4: Advanced asymmetric left disc narrowing and endplate ridging. Hypertrophic facet arthropathy and ligamentum flavum thickening greater on the left. Mild spinal stenosis with noncompressive left subarticular recess narrowing. Left foraminal impingement on L3.  L4-L5: Advanced disc narrowing with disc bulging and endplate ridging. Facet and ligament hypertrophy. Advanced spinal stenosis with near complete effacement of CSF. Foraminal narrowing without L4 compression.  L5-S1:Bilateral facet arthropathy with anterolisthesis and mild to moderate bilateral foraminal narrowing.  IMPRESSION: 1. Diffuse advanced disc and facet degeneration as described. 2. L1-2 moderate right foraminal impingement. 3. L2-3 advanced spinal stenosis and left foraminal impingement. 4. L3-4 left foraminal impingement. 5. L4-5 advanced spinal stenosis.   Electronically Signed   By: Marnee Spring M.D.   On: 02/05/2017 14:37      PMFS History: Patient Active Problem List   Diagnosis Date Noted  . Right-sided Bell's palsy 05/08/2017  . Spinal stenosis of lumbar region without neurogenic claudication 05/08/2017  . Gait instability 05/08/2017  . Neuropathy  05/08/2017   Past Medical History:  Diagnosis Date  . Hypercholesteremia   . Hypertension   . Leg cramps     No family history on file.  Past Surgical History:  Procedure Laterality Date  . ABDOMINAL HYSTERECTOMY    . APPENDECTOMY     Social History   Occupational History  . Not on file.   Social History Main Topics  . Smoking status: Never Smoker  . Smokeless tobacco: Never Used  . Alcohol use 0.6 oz/week    1 Shots of liquor per week  . Drug use: No  . Sexual activity: Not on file

## 2017-06-24 DIAGNOSIS — D500 Iron deficiency anemia secondary to blood loss (chronic): Secondary | ICD-10-CM | POA: Diagnosis not present

## 2017-06-24 DIAGNOSIS — K219 Gastro-esophageal reflux disease without esophagitis: Secondary | ICD-10-CM | POA: Diagnosis not present

## 2017-07-01 DIAGNOSIS — Z23 Encounter for immunization: Secondary | ICD-10-CM | POA: Diagnosis not present

## 2017-07-02 ENCOUNTER — Ambulatory Visit: Payer: Medicare Other | Admitting: Physical Therapy

## 2017-07-10 ENCOUNTER — Ambulatory Visit: Payer: Medicare Other

## 2017-07-16 ENCOUNTER — Ambulatory Visit: Payer: Medicare Other | Attending: Neurology | Admitting: Physical Therapy

## 2017-07-16 ENCOUNTER — Encounter: Payer: Self-pay | Admitting: Physical Therapy

## 2017-07-16 DIAGNOSIS — M6281 Muscle weakness (generalized): Secondary | ICD-10-CM | POA: Diagnosis not present

## 2017-07-16 NOTE — Therapy (Signed)
[facility] [address] [place], [place], [address] Phone: [phone]   Fax:  [phone]  Physical Therapy Evaluation  Patient Details  Name: Claire Jensen MRN: [id number] Date of Birth: Jun 17, 1935 Referring Provider: Patrcia Dolly   Encounter Date: 07/16/2017  PT End of Session - 07/16/17 2306    Visit Number  1    Number of Visits  1    Authorization Type  Medicare-GCODE    PT Start Time  1104    PT Stop Time  1145    PT Time Calculation (min)  41 min    Activity Tolerance  Patient tolerated treatment well    Behavior During Therapy  [facility] for tasks assessed/performed       Past Medical History:  Diagnosis Date  . Hypercholesteremia   . Hypertension   . Leg cramps     Past Surgical History:  Procedure Laterality Date  . ABDOMINAL HYSTERECTOMY    . APPENDECTOMY      There were no vitals filed for this visit.   Subjective Assessment - 07/16/17 1110    Subjective  Not sure which doctor sent me here.  I know that since the stenosis, the L foot makes me stumble at some times.  Reports no falls.  Does not use assistive device.    Patient Stated Goals  Pt's goals for therapy:  that's a good question, I'm not sure why I'm here.    Currently in Pain?  No/denies         [facility] PT Assessment - 07/16/17 1114      Assessment   Medical Diagnosis  spinal stenosis, gait imbalance, neuropathy    Referring Provider  Patrcia Dolly    Onset Date/Surgical Date  05/08/17 MD visit to Dr. Karel Jarvis   MD visit to Dr. Karel Jarvis     Precautions   Precautions  Fall      Balance Screen   Has the patient fallen in the past 6 months  No    Has the patient had a decrease in activity level because of a fear of falling?   Yes    Is the patient reluctant to leave their home because of a fear of falling?   No      Home Public house manager residence    Living Arrangements  Alone Spouse comes from [place]  often   Spouse comes from [place] often   Available Help at Discharge  Family    Type of Home  House    Home Access  Level entry    Home Layout  One level    Home Equipment  None    Additional Comments  Recently moved from [place]; husband travels alot for work and lives out of home in [place]; children live in this area      Prior Function   Level of Independence  Independent    Leisure  Enjoys walking (about a mile), but not able to walk that distance.        Observation/Other Assessments   Focus on Therapeutic Outcomes (FOTO)   NA      Posture/Postural Control   Posture/Postural Control  Postural limitations    Postural Limitations  Rounded Shoulders      ROM / Strength   AROM / PROM / Strength  Strength      Strength   Overall Strength Comments  Grossly tested 4+/5 RLE, 4/5 LLE      Transfers  Transfers  Sit to Stand;Stand to Sit    Sit to Stand  7: Independent;With upper extremity assist;Without upper extremity assist;From chair/3-in-1    Stand to Sit  7: Independent;Without upper extremity assist;With upper extremity assist;To chair/3-in-1      Ambulation/Gait   Ambulation/Gait  Yes    Ambulation/Gait Assistance  7: Independent    Ambulation Distance (Feet)  200 Feet    Assistive device  None    Gait Pattern  Step-through pattern    Ambulation Surface  Level;Indoor    Gait velocity  9.71 sec = 3.38 ft/sec      Standardized Balance Assessment   Standardized Balance Assessment  Dynamic Gait Index;Timed Up and Go Test;Berg Balance Test      Berg Balance Test   Sit to Stand  Able to stand without using hands and stabilize independently    Standing Unsupported  Able to stand safely 2 minutes    Sitting with Back Unsupported but Feet Supported on Floor or Stool  Able to sit safely and securely 2 minutes    Stand to Sit  Sits safely with minimal use of hands    Transfers  Able to transfer safely, minor use of hands    Standing Unsupported with Eyes Closed  Able to stand 10  seconds safely    Standing Ubsupported with Feet Together  Able to place feet together independently and stand 1 minute safely    From Standing, Reach Forward with Outstretched Arm  Can reach confidently >25 cm (10")    From Standing Position, Pick up Object from Floor  Able to pick up shoe safely and easily    From Standing Position, Turn to Look Behind Over each Shoulder  Looks behind from both sides and weight shifts well    Turn 360 Degrees  Able to turn 360 degrees safely in 4 seconds or less    Standing Unsupported, Alternately Place Feet on Step/Stool  Able to stand independently and safely and complete 8 steps in 20 seconds    Standing Unsupported, One Foot in Front  Able to place foot tandem independently and hold 30 seconds    Standing on One Leg  Tries to lift leg/unable to hold 3 seconds but remains standing independently    Total Score  53    Berg comment:  Scores <45/56 indicate increased fall risk.      Dynamic Gait Index   Level Surface  Normal    Change in Gait Speed  Normal    Gait with Horizontal Head Turns  Mild Impairment    Gait with Vertical Head Turns  Mild Impairment    Gait and Pivot Turn  Normal    Step Over Obstacle  Normal    Step Around Obstacles  Normal    Steps  Mild Impairment    Total Score  21    DGI comment:  Scores <19/24 indicate increased fall risk.      Timed Up and Go Test   Normal TUG (seconds)  12.88    TUG Comments  Scores >13.5 seconds indicate increased fall risk.             Objective measurements completed on examination: See above findings.              PT Education - 07/16/17 2306    Education provided  Yes    Education Details  LLE strengthening exercises    Person(s) Educated  Patient    Methods  Explanation;Demonstration;Handout  Comprehension  Verbalized understanding;Returned demonstration          PT Long Term Goals - 07/16/17 2311      PT LONG TERM GOAL #1   Title  Pt will be independent with  HEP to address lower extremity strength.      Time  1    Period  Days    Status  Achieved             Plan - 07/16/17 2307    Clinical Impression Statement  Pt is 82 year old female who presents to OP PT with diagnosis of spinal stenosis and gait imbalance, with recent history of Bell's palsy.  Pt has had no falls and feels spinal stenosis pain is managed through medications.  She has LLE weakness, but no significant balance deficits and is not at fall risk per Berg, TUG or Dynamic Gait Index scores.  Provided patient with LLE stregnthening HEP and no further PT appears to be needed at this time.    History and Personal Factors relevant to plan of care:  PMH spinal stenosis and Bell's palsy    Clinical Presentation  Stable    Clinical Decision Making  Low    Rehab Potential  Good    PT Frequency  One time visit    PT Treatment/Interventions  Therapeutic exercise    PT Next Visit Plan  No further PT needed beyond eval visit    Consulted and Agree with Plan of Care  Patient       Patient will benefit from skilled therapeutic intervention in order to improve the following deficits and impairments:  Decreased strength  Visit Diagnosis: Muscle weakness (generalized)  G-Codes - 07/16/17 2312    Functional Assessment Tool Used (Outpatient Only)  gait velocity 3.38 ft/sec, DGI 21/24, Berg 53/56, TUG 12.88 sec    Functional Limitation  Mobility: Walking and moving around    Mobility: Walking and Moving Around Current Status [phone]([id number])  At least 1 percent but less than 20 percent impaired, limited or restricted    Mobility: Walking and Moving Around Goal Status [phone](G8979)  At least 1 percent but less than 20 percent impaired, limited or restricted    Mobility: Walking and Moving Around Discharge Status [phone]([id number])  At least 1 percent but less than 20 percent impaired, limited or restricted        Problem List Patient Active Problem List   Diagnosis Date Noted  . Right-sided Bell's palsy  05/08/2017  . Spinal stenosis of lumbar region without neurogenic claudication 05/08/2017  . Gait instability 05/08/2017  . Neuropathy 05/08/2017    Raygen Dahm W. 07/16/2017, 11:13 PM  Gean MaidensMARRIOTT,Charletta Voight W., PT   [facility] [address] [place], [place], [address] Phone: [phone]   Fax:  [phone]  Name: Claire Jensen MRN: [id number] Date of Birth: May 10, 1935

## 2017-07-16 NOTE — Patient Instructions (Addendum)
KNEE: Extension, Long Arc Quads - Sitting    Raise leg until knee is straight. _10__ reps per set, __2_ sets per day.  Copyright  VHI. All rights reserved.  Seated Alternating Leg Raise (Marching)    Sit on edge of chair. Raise bent knee and return. Repeat with other leg. Do _2__ sets of _10__ repetitions.  Copyright  VHI. All rights reserved.  ANKLE: Pumps    Point toes down, then up. _10__ reps per set, __2_ sets per day.  Copyright  VHI. All rights reserved.

## 2017-08-12 DIAGNOSIS — I10 Essential (primary) hypertension: Secondary | ICD-10-CM | POA: Diagnosis not present

## 2017-08-12 DIAGNOSIS — D508 Other iron deficiency anemias: Secondary | ICD-10-CM | POA: Diagnosis not present

## 2017-08-12 DIAGNOSIS — K219 Gastro-esophageal reflux disease without esophagitis: Secondary | ICD-10-CM | POA: Diagnosis not present

## 2017-08-12 DIAGNOSIS — G510 Bell's palsy: Secondary | ICD-10-CM | POA: Diagnosis not present

## 2017-09-24 ENCOUNTER — Encounter (INDEPENDENT_AMBULATORY_CARE_PROVIDER_SITE_OTHER): Payer: Self-pay | Admitting: Orthopaedic Surgery

## 2017-09-24 ENCOUNTER — Ambulatory Visit (INDEPENDENT_AMBULATORY_CARE_PROVIDER_SITE_OTHER): Payer: Medicare Other | Admitting: Orthopaedic Surgery

## 2017-09-24 VITALS — Ht 62.0 in | Wt 164.0 lb

## 2017-09-24 DIAGNOSIS — M48061 Spinal stenosis, lumbar region without neurogenic claudication: Secondary | ICD-10-CM | POA: Diagnosis not present

## 2017-09-24 NOTE — Progress Notes (Signed)
Office Visit Note   Patient: Claire Jensen           Date of Birth: 06-11-1935           MRN: [id number] Visit Date: 09/24/2017              Requested by: Kendrick Ranch, MD [address] [place], [place] [address] PCP: Kendrick Ranch, MD   Assessment & Plan: Visit Diagnoses:  1. Spinal stenosis of lumbar region without neurogenic claudication     Plan: Stable L2-3, L3-4 severe lumbar spinal stenosis.  Comfortable using the Tylenol and 2 ibuprofen over-the-counter daily.  If she gets increased and neurogenic claudication symptoms she can return.  Follow-Up Instructions: Return if symptoms worsen or fail to improve.   Orders:  No orders of the defined types were placed in this encounter.  No orders of the defined types were placed in this encounter.     Procedures: No procedures performed   Clinical Data: No additional findings.   Subjective: Chief Complaint  Patient presents with  . Lower Back - Pain, Follow-up    HPI 83 year old female returns 3 months follow-up or severe spinal stenosis L2-3 and L3-4.  She states she is gotten significant improvement using 2 Advil and 2 Tylenol daily.  Her activity is improved she is more comfortable is ambulating in the community.  She denies fever or chills no bowel or bladder symptoms.  Review of Systems 14 point review of systems reviewed updated unchanged from 06/21/18.  She has severe spinal stenosis at L2-3 and L3-4.  Bell's palsy now times 7 months with very slow gradual improvement.  She still having symptoms related to this.  Otherwise negative as it pertains HPI.   Objective: Vital Signs: Ht 5\' 2"  (1.575 m)   Wt 164 lb (74.4 kg)   BMI 30.00 kg/m   Physical Exam  Constitutional: She is oriented to person, place, and time. She appears well-developed.  HENT:  Head: Normocephalic.  Right Ear: External ear normal.  Left Ear: External ear normal.  Eyes: Pupils are equal, round, and reactive to  light.  Neck: No tracheal deviation present. No thyromegaly present.  Cardiovascular: Normal rate.  Pulmonary/Chest: Effort normal.  Abdominal: Soft.  Neurological: She is alert and oriented to person, place, and time.  Skin: Skin is warm and dry.  Psychiatric: She has a normal mood and affect. Her behavior is normal.    Ortho Exam patient ambulates without limp.  Good quad strength.  Good hip range of motion.  Mild tenderness to palpation lumbar spine.  From sitting to standing more comfortably.  Not using any walking aids.  Specialty Comments:  No specialty comments available.  Imaging: No results found.   PMFS History: Patient Active Problem List   Diagnosis Date Noted  . Right-sided Bell's palsy 05/08/2017  . Spinal stenosis of lumbar region without neurogenic claudication 05/08/2017  . Gait instability 05/08/2017  . Neuropathy 05/08/2017   Past Medical History:  Diagnosis Date  . Hypercholesteremia   . Hypertension   . Leg cramps     History reviewed. No pertinent family history.  Past Surgical History:  Procedure Laterality Date  . ABDOMINAL HYSTERECTOMY    . APPENDECTOMY     Social History   Occupational History  . Not on file  Tobacco Use  . Smoking status: Never Smoker  . Smokeless tobacco: Never Used  Substance and Sexual Activity  . Alcohol use: Yes    Alcohol/week:  0.6 oz    Types: 1 Shots of liquor per week  . Drug use: No  . Sexual activity: Not on file

## 2017-10-08 ENCOUNTER — Encounter: Payer: Self-pay | Admitting: Neurology

## 2017-10-08 ENCOUNTER — Ambulatory Visit (INDEPENDENT_AMBULATORY_CARE_PROVIDER_SITE_OTHER): Payer: Medicare Other | Admitting: Neurology

## 2017-10-08 VITALS — BP 120/64 | HR 95 | Ht 62.0 in | Wt 167.1 lb

## 2017-10-08 DIAGNOSIS — G510 Bell's palsy: Secondary | ICD-10-CM | POA: Diagnosis not present

## 2017-10-08 NOTE — Progress Notes (Signed)
NEUROLOGY FOLLOW UP OFFICE NOTE  Claire Jensen [id number] 08-28-1935  HISTORY OF PRESENT ILLNESS: I had the pleasure of seeing Claire Jensen in follow-up in the neurology clinic on 10/08/2017.  The patient was last seen 5 months ago for right-sided Bell's palsy. She feels that the facial weakness is still obvious and bothersome for her. There has actually been significant improvement in facial weakness, no disfiguring asymmetry clearly noticeable. She mostly has difficulty with eating, it is hard to eat soup with a spoon. Her right eye is stinging. She still has numbness and taste changes on the right side of her tongue. She denies any headaches but has occasional pains on the right side of her face. She has noticed occasional tearing up when she chews ("crocodile tears"). She denies any dizziness, focal numbness/tingling/weakness in extremities, no falls.   HPI 05/08/2017: This is a very pleasant 83 yo RH woman with a history of hypertension, hyperlipidemia, back pain, who presented with right-sided Bell's palsy. She woke up on 01/20/17 and felt the right side of tongue was numb and became progressively numb. She noticed drooping around her right eye and mouth and thought she had a stroke so she went to [facility] ER where she was diagnosed with bell's palsy. She had a head CT without contrast which I personally reviewed, no acute changes. She was discharged home on a course of Prednisone and Valtrex. She reports this is the 13th week but symptoms still continue. She does note that the weakness has gotten better, she still has numbness on the right side of her tongue and problems drinking from a cup, but can now close her eye better. She has seen ophthalmologist Dr. Alben Spittle and uses Systane eye drops, but still feels like there is an eye lash over her right eye. She recalls having right-sided neck pain and pain behind the right ear 2 days before facial weakness onset. She denies any recent infections. No  headaches, dizziness, diplopia, dysarthria/dysphagia, focal numbness/tingling/weakness, no facial paresthesias or pain. She denies any prior history of Bell's palsy, no family history. She has chronic back pain and takes multiple doses of Advil and Excedrin on a daily basis. She had been going back and forth to [place] where she sees her physician, and brings a copy of her MRI lumbar spine done 02/05/17 which reported diffuse advanced disc and facet disease, with L1-2 moderate right foraminal impingement, L2-3 advanced spinal stenosis and left foraminal impingement, L3-4 left foraminal impingement, L4-5 advanced spinal stenosis. She notes imbalance for many years, no falls. She had constipation, which improved after magnesium for back pain was started.   Laboratory Data: CBC, CMP, TSH were unremarkable. B12 was 359. PAST MEDICAL HISTORY: Past Medical History:  Diagnosis Date  . Hypercholesteremia   . Hypertension   . Leg cramps     MEDICATIONS: Current Outpatient Medications on File Prior to Visit  Medication Sig Dispense Refill  . acetaminophen (TYLENOL) 500 MG tablet Take 500 mg by mouth as needed.    Marland Kitchen aspirin EC 81 MG tablet Take 81 mg by mouth daily.    Marland Kitchen atorvastatin (LIPITOR) 20 MG tablet Take 20 mg by mouth daily.    . cholecalciferol (VITAMIN D) 1000 units tablet Take 1,000 Units by mouth daily.    Marland Kitchen esomeprazole (NEXIUM) 10 MG packet Take 10 mg by mouth daily before breakfast.    . ferrous sulfate 325 (65 FE) MG tablet TAKE ONE CAPSULE BY MOUTH DAILY  2  . Ibuprofen (ADVIL) 200 MG  CAPS Take by mouth as needed.    . Iron-FA-B Cmp-C-Biot-Probiotic (FUSION PLUS PO) Take 1 day or 1 dose by mouth.    Marland Kitchen. lisinopril (PRINIVIL,ZESTRIL) 10 MG tablet Take 15 mg daily by mouth.     . magnesium 30 MG tablet Take 3 mg by mouth 2 (two) times daily.     No current facility-administered medications on file prior to visit.     ALLERGIES: No Known Allergies  FAMILY HISTORY: No family history  on file.  SOCIAL HISTORY: Social History   Socioeconomic History  . Marital status: Single    Spouse name: Not on file  . Number of children: Not on file  . Years of education: Not on file  . Highest education level: Not on file  Social Needs  . Financial resource strain: Not on file  . Food insecurity - worry: Not on file  . Food insecurity - inability: Not on file  . Transportation needs - medical: Not on file  . Transportation needs - non-medical: Not on file  Occupational History  . Not on file  Tobacco Use  . Smoking status: Never Smoker  . Smokeless tobacco: Never Used  Substance and Sexual Activity  . Alcohol use: Yes    Alcohol/week: 0.6 oz    Types: 1 Shots of liquor per week  . Drug use: No  . Sexual activity: Not on file  Other Topics Concern  . 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      REVIEW OF SYSTEMS: Constitutional: No fevers, chills, or sweats, no generalized fatigue, change in appetite Eyes: No visual changes, double vision, eye pain Ear, nose and throat: No hearing loss, ear pain, nasal congestion, sore throat Cardiovascular: No chest pain, palpitations Respiratory:  No shortness of breath at rest or with exertion, wheezes GastrointestinaI: No nausea, vomiting, diarrhea, abdominal pain, fecal incontinence Genitourinary:  No dysuria, urinary retention or frequency Musculoskeletal:  + neck pain, back pain Integumentary: No rash, pruritus, skin lesions Neurological: as above Psychiatric: No depression, insomnia, anxiety Endocrine: No palpitations, fatigue, diaphoresis, mood swings, change in appetite, change in weight, increased thirst Hematologic/Lymphatic:  No anemia, purpura, petechiae. Allergic/Immunologic: no itchy/runny eyes, nasal congestion, recent allergic reactions, rashes  PHYSICAL  EXAM: Vitals:   10/08/17 1253  BP: 120/64  Pulse: 95  SpO2: 98%   General: No acute distress Head:  Normocephalic/atraumatic Neck: supple, no paraspinal tenderness, full range of motion Heart:  Regular rate and rhythm Lungs:  Clear to auscultation bilaterally Back: No paraspinal tenderness Skin/Extremities: No rash, no edema Neurological Exam: alert and oriented to person, place, and time. No aphasia or dysarthria. Fund of knowledge is appropriate.  Recent and remote memory are intact.  Attention and concentration are normal.    Able to name objects and repeat phrases. Cranial nerves: Pupils equal, round, reactive to light.  Extraocular movements intact with no nystagmus. Visual fields full. Facial sensation intact. She has had significant improvement in facial weakness, there is some contracture around the right orbicularis oris, no synkinesis, +right lid lag, mouth slightly weak with maximum effort, full frontalis movement, House-Brackmann grade II mild dysfunction.Tongue, uvula, palate midline.  Motor: Bulk and tone normal, muscle strength 5/5 throughout with no pronator drift.  Sensation to light touch intact.  No extinction to double simultaneous stimulation.  Deep tendon reflexes  2+ throughout, toes downgoing.  Finger to nose testing intact.  Gait narrow-based and steady, mild difficulty with tandem walk but able.  Romberg negative.  IMPRESSION: This is a very pleasant 83 yo RH woman with a history of hypertension, hyperlipidemia, back pain, who had right-sided Bell's palsy at the end of May 2018. She has had significant improvement in facial weakness, there is slight contracture at the cheek and mild mouth weakness. We discussed long term prognosis at this point and likely we have reached full recovery potential at this point 8 months out. She reports stinging in her eye, we discussed the importance of continued eye care after Bell's palsy. She will follow-up on a prn basis and knows to call  for any changes.   Thank you for allowing me to participate in her care.  Please do not hesitate to call for any questions or concerns.  The duration of this appointment visit was 15 minutes of face-to-face time with the patient.  Greater than 50% of this time was spent in counseling, explanation of diagnosis, planning of further management, and coordination of care.   Patrcia Dolly, M.D.   CC: Dr. Constance Goltz

## 2017-10-08 NOTE — Patient Instructions (Signed)
Looking good. Continue with eye care. Call our office for any changes, follow-up as needed

## 2017-10-15 DIAGNOSIS — H02203 Unspecified lagophthalmos right eye, unspecified eyelid: Secondary | ICD-10-CM | POA: Diagnosis not present

## 2017-10-15 DIAGNOSIS — G510 Bell's palsy: Secondary | ICD-10-CM | POA: Diagnosis not present

## 2017-10-15 DIAGNOSIS — H18891 Other specified disorders of cornea, right eye: Secondary | ICD-10-CM | POA: Diagnosis not present

## 2017-10-16 ENCOUNTER — Encounter: Payer: Self-pay | Admitting: Neurology

## 2018-02-26 ENCOUNTER — Other Ambulatory Visit: Payer: Self-pay | Admitting: Internal Medicine

## 2018-02-26 DIAGNOSIS — E559 Vitamin D deficiency, unspecified: Secondary | ICD-10-CM | POA: Diagnosis not present

## 2018-02-26 DIAGNOSIS — Z23 Encounter for immunization: Secondary | ICD-10-CM | POA: Diagnosis not present

## 2018-02-26 DIAGNOSIS — E7800 Pure hypercholesterolemia, unspecified: Secondary | ICD-10-CM | POA: Diagnosis not present

## 2018-02-26 DIAGNOSIS — M48061 Spinal stenosis, lumbar region without neurogenic claudication: Secondary | ICD-10-CM | POA: Diagnosis not present

## 2018-02-26 DIAGNOSIS — Z0000 Encounter for general adult medical examination without abnormal findings: Secondary | ICD-10-CM | POA: Diagnosis not present

## 2018-02-26 DIAGNOSIS — I10 Essential (primary) hypertension: Secondary | ICD-10-CM | POA: Diagnosis not present

## 2018-02-26 DIAGNOSIS — D500 Iron deficiency anemia secondary to blood loss (chronic): Secondary | ICD-10-CM | POA: Diagnosis not present

## 2018-02-26 DIAGNOSIS — E2839 Other primary ovarian failure: Secondary | ICD-10-CM | POA: Diagnosis not present

## 2018-02-26 DIAGNOSIS — Z1389 Encounter for screening for other disorder: Secondary | ICD-10-CM | POA: Diagnosis not present

## 2018-02-26 DIAGNOSIS — Z1239 Encounter for other screening for malignant neoplasm of breast: Secondary | ICD-10-CM | POA: Diagnosis not present

## 2018-02-26 DIAGNOSIS — Z1231 Encounter for screening mammogram for malignant neoplasm of breast: Secondary | ICD-10-CM

## 2018-02-26 DIAGNOSIS — Z7189 Other specified counseling: Secondary | ICD-10-CM | POA: Diagnosis not present

## 2018-03-19 DIAGNOSIS — L820 Inflamed seborrheic keratosis: Secondary | ICD-10-CM | POA: Diagnosis not present

## 2018-03-21 ENCOUNTER — Ambulatory Visit: Payer: Medicare Other

## 2018-04-07 DIAGNOSIS — H2513 Age-related nuclear cataract, bilateral: Secondary | ICD-10-CM | POA: Diagnosis not present

## 2018-04-07 DIAGNOSIS — G510 Bell's palsy: Secondary | ICD-10-CM | POA: Diagnosis not present

## 2018-04-07 DIAGNOSIS — H25013 Cortical age-related cataract, bilateral: Secondary | ICD-10-CM | POA: Diagnosis not present

## 2018-06-02 DIAGNOSIS — Z23 Encounter for immunization: Secondary | ICD-10-CM | POA: Diagnosis not present

## 2018-09-23 DIAGNOSIS — G510 Bell's palsy: Secondary | ICD-10-CM | POA: Diagnosis not present

## 2018-09-23 DIAGNOSIS — H25013 Cortical age-related cataract, bilateral: Secondary | ICD-10-CM | POA: Diagnosis not present

## 2018-09-23 DIAGNOSIS — H25012 Cortical age-related cataract, left eye: Secondary | ICD-10-CM | POA: Diagnosis not present

## 2018-09-23 DIAGNOSIS — H35033 Hypertensive retinopathy, bilateral: Secondary | ICD-10-CM | POA: Diagnosis not present

## 2018-09-23 DIAGNOSIS — H2512 Age-related nuclear cataract, left eye: Secondary | ICD-10-CM | POA: Diagnosis not present

## 2018-09-23 DIAGNOSIS — H2513 Age-related nuclear cataract, bilateral: Secondary | ICD-10-CM | POA: Diagnosis not present

## 2018-10-08 DIAGNOSIS — H2512 Age-related nuclear cataract, left eye: Secondary | ICD-10-CM | POA: Diagnosis not present

## 2018-10-14 DIAGNOSIS — H2511 Age-related nuclear cataract, right eye: Secondary | ICD-10-CM | POA: Diagnosis not present

## 2018-10-22 DIAGNOSIS — H2511 Age-related nuclear cataract, right eye: Secondary | ICD-10-CM | POA: Diagnosis not present

## 2018-10-22 DIAGNOSIS — H25811 Combined forms of age-related cataract, right eye: Secondary | ICD-10-CM | POA: Diagnosis not present

## 2018-12-30 DIAGNOSIS — Z7184 Encounter for health counseling related to travel: Secondary | ICD-10-CM | POA: Diagnosis not present

## 2019-02-06 DIAGNOSIS — H18811 Anesthesia and hypoesthesia of cornea, right eye: Secondary | ICD-10-CM | POA: Diagnosis not present

## 2019-02-06 DIAGNOSIS — G510 Bell's palsy: Secondary | ICD-10-CM | POA: Diagnosis not present

## 2019-03-03 DIAGNOSIS — I10 Essential (primary) hypertension: Secondary | ICD-10-CM | POA: Diagnosis not present

## 2019-03-03 DIAGNOSIS — E7800 Pure hypercholesterolemia, unspecified: Secondary | ICD-10-CM | POA: Diagnosis not present

## 2019-03-03 DIAGNOSIS — N183 Chronic kidney disease, stage 3 (moderate): Secondary | ICD-10-CM | POA: Diagnosis not present

## 2019-06-17 ENCOUNTER — Other Ambulatory Visit: Payer: Self-pay | Admitting: Internal Medicine

## 2019-06-17 DIAGNOSIS — Z0000 Encounter for general adult medical examination without abnormal findings: Secondary | ICD-10-CM | POA: Diagnosis not present

## 2019-06-17 DIAGNOSIS — Z6833 Body mass index (BMI) 33.0-33.9, adult: Secondary | ICD-10-CM | POA: Diagnosis not present

## 2019-06-17 DIAGNOSIS — I10 Essential (primary) hypertension: Secondary | ICD-10-CM | POA: Diagnosis not present

## 2019-06-17 DIAGNOSIS — E2839 Other primary ovarian failure: Secondary | ICD-10-CM

## 2019-06-17 DIAGNOSIS — Z780 Asymptomatic menopausal state: Secondary | ICD-10-CM | POA: Diagnosis not present

## 2019-06-17 DIAGNOSIS — K219 Gastro-esophageal reflux disease without esophagitis: Secondary | ICD-10-CM | POA: Diagnosis not present

## 2019-06-17 DIAGNOSIS — Z90710 Acquired absence of both cervix and uterus: Secondary | ICD-10-CM | POA: Diagnosis not present

## 2019-06-17 DIAGNOSIS — D500 Iron deficiency anemia secondary to blood loss (chronic): Secondary | ICD-10-CM | POA: Diagnosis not present

## 2019-06-17 DIAGNOSIS — M48061 Spinal stenosis, lumbar region without neurogenic claudication: Secondary | ICD-10-CM | POA: Diagnosis not present

## 2019-06-17 DIAGNOSIS — E7800 Pure hypercholesterolemia, unspecified: Secondary | ICD-10-CM | POA: Diagnosis not present

## 2019-06-17 DIAGNOSIS — Z23 Encounter for immunization: Secondary | ICD-10-CM | POA: Diagnosis not present

## 2019-08-11 DIAGNOSIS — H35033 Hypertensive retinopathy, bilateral: Secondary | ICD-10-CM | POA: Diagnosis not present

## 2019-08-11 DIAGNOSIS — G510 Bell's palsy: Secondary | ICD-10-CM | POA: Diagnosis not present

## 2019-08-11 DIAGNOSIS — Z961 Presence of intraocular lens: Secondary | ICD-10-CM | POA: Diagnosis not present

## 2019-08-11 DIAGNOSIS — H18811 Anesthesia and hypoesthesia of cornea, right eye: Secondary | ICD-10-CM | POA: Diagnosis not present

## 2019-09-02 ENCOUNTER — Other Ambulatory Visit: Payer: Medicare Other

## 2020-12-26 DIAGNOSIS — E7800 Pure hypercholesterolemia, unspecified: Secondary | ICD-10-CM | POA: Diagnosis not present

## 2020-12-26 DIAGNOSIS — N183 Chronic kidney disease, stage 3 unspecified: Secondary | ICD-10-CM | POA: Diagnosis not present

## 2020-12-26 DIAGNOSIS — D500 Iron deficiency anemia secondary to blood loss (chronic): Secondary | ICD-10-CM | POA: Diagnosis not present

## 2020-12-26 DIAGNOSIS — R195 Other fecal abnormalities: Secondary | ICD-10-CM | POA: Diagnosis not present

## 2020-12-26 DIAGNOSIS — I10 Essential (primary) hypertension: Secondary | ICD-10-CM | POA: Diagnosis not present

## 2020-12-26 DIAGNOSIS — Z23 Encounter for immunization: Secondary | ICD-10-CM | POA: Diagnosis not present

## 2021-01-23 DIAGNOSIS — Z20822 Contact with and (suspected) exposure to covid-19: Secondary | ICD-10-CM | POA: Diagnosis not present

## 2021-06-23 DIAGNOSIS — H35033 Hypertensive retinopathy, bilateral: Secondary | ICD-10-CM | POA: Diagnosis not present

## 2021-06-23 DIAGNOSIS — E559 Vitamin D deficiency, unspecified: Secondary | ICD-10-CM | POA: Diagnosis not present

## 2021-06-23 DIAGNOSIS — Z23 Encounter for immunization: Secondary | ICD-10-CM | POA: Diagnosis not present

## 2021-06-23 DIAGNOSIS — M48061 Spinal stenosis, lumbar region without neurogenic claudication: Secondary | ICD-10-CM | POA: Diagnosis not present

## 2021-06-23 DIAGNOSIS — G510 Bell's palsy: Secondary | ICD-10-CM | POA: Diagnosis not present

## 2021-06-23 DIAGNOSIS — D500 Iron deficiency anemia secondary to blood loss (chronic): Secondary | ICD-10-CM | POA: Diagnosis not present

## 2021-06-23 DIAGNOSIS — K219 Gastro-esophageal reflux disease without esophagitis: Secondary | ICD-10-CM | POA: Diagnosis not present

## 2021-06-23 DIAGNOSIS — R195 Other fecal abnormalities: Secondary | ICD-10-CM | POA: Diagnosis not present

## 2021-06-23 DIAGNOSIS — Z0000 Encounter for general adult medical examination without abnormal findings: Secondary | ICD-10-CM | POA: Diagnosis not present

## 2021-06-23 DIAGNOSIS — Z7189 Other specified counseling: Secondary | ICD-10-CM | POA: Diagnosis not present

## 2021-06-23 DIAGNOSIS — N183 Chronic kidney disease, stage 3 unspecified: Secondary | ICD-10-CM | POA: Diagnosis not present

## 2021-06-23 DIAGNOSIS — I10 Essential (primary) hypertension: Secondary | ICD-10-CM | POA: Diagnosis not present

## 2021-06-23 DIAGNOSIS — Z90710 Acquired absence of both cervix and uterus: Secondary | ICD-10-CM | POA: Diagnosis not present

## 2021-06-23 DIAGNOSIS — Z1389 Encounter for screening for other disorder: Secondary | ICD-10-CM | POA: Diagnosis not present

## 2021-06-23 DIAGNOSIS — K635 Polyp of colon: Secondary | ICD-10-CM | POA: Diagnosis not present

## 2021-06-28 ENCOUNTER — Other Ambulatory Visit: Payer: Self-pay | Admitting: Internal Medicine

## 2021-06-28 DIAGNOSIS — E2839 Other primary ovarian failure: Secondary | ICD-10-CM

## 2021-07-04 DIAGNOSIS — Z23 Encounter for immunization: Secondary | ICD-10-CM | POA: Diagnosis not present

## 2021-12-14 ENCOUNTER — Ambulatory Visit
Admission: RE | Admit: 2021-12-14 | Discharge: 2021-12-14 | Disposition: A | Discharge status: Home / Self Care | Payer: Medicare Other | Source: Ambulatory Visit | Attending: Internal Medicine | Admitting: Internal Medicine

## 2021-12-14 DIAGNOSIS — M8589 Other specified disorders of bone density and structure, multiple sites: Secondary | ICD-10-CM | POA: Diagnosis not present

## 2021-12-14 DIAGNOSIS — E2839 Other primary ovarian failure: Secondary | ICD-10-CM

## 2021-12-14 DIAGNOSIS — Z780 Asymptomatic menopausal state: Secondary | ICD-10-CM | POA: Diagnosis not present

## 2021-12-22 DIAGNOSIS — N183 Chronic kidney disease, stage 3 unspecified: Secondary | ICD-10-CM | POA: Diagnosis not present

## 2021-12-22 DIAGNOSIS — K219 Gastro-esophageal reflux disease without esophagitis: Secondary | ICD-10-CM | POA: Diagnosis not present

## 2021-12-22 DIAGNOSIS — N1831 Chronic kidney disease, stage 3a: Secondary | ICD-10-CM | POA: Diagnosis not present

## 2021-12-22 DIAGNOSIS — M85859 Other specified disorders of bone density and structure, unspecified thigh: Secondary | ICD-10-CM | POA: Diagnosis not present

## 2021-12-22 DIAGNOSIS — I10 Essential (primary) hypertension: Secondary | ICD-10-CM | POA: Diagnosis not present

## 2022-07-19 DIAGNOSIS — D500 Iron deficiency anemia secondary to blood loss (chronic): Secondary | ICD-10-CM | POA: Diagnosis not present

## 2022-07-19 DIAGNOSIS — Z23 Encounter for immunization: Secondary | ICD-10-CM | POA: Diagnosis not present

## 2022-07-19 DIAGNOSIS — Z1331 Encounter for screening for depression: Secondary | ICD-10-CM | POA: Diagnosis not present

## 2022-07-19 DIAGNOSIS — I10 Essential (primary) hypertension: Secondary | ICD-10-CM | POA: Diagnosis not present

## 2022-07-19 DIAGNOSIS — K219 Gastro-esophageal reflux disease without esophagitis: Secondary | ICD-10-CM | POA: Diagnosis not present

## 2022-07-19 DIAGNOSIS — E7800 Pure hypercholesterolemia, unspecified: Secondary | ICD-10-CM | POA: Diagnosis not present

## 2022-07-19 DIAGNOSIS — Z0000 Encounter for general adult medical examination without abnormal findings: Secondary | ICD-10-CM | POA: Diagnosis not present

## 2022-07-19 DIAGNOSIS — R202 Paresthesia of skin: Secondary | ICD-10-CM | POA: Diagnosis not present

## 2022-07-19 DIAGNOSIS — H35033 Hypertensive retinopathy, bilateral: Secondary | ICD-10-CM | POA: Diagnosis not present

## 2022-07-19 DIAGNOSIS — Z90710 Acquired absence of both cervix and uterus: Secondary | ICD-10-CM | POA: Diagnosis not present

## 2022-07-19 DIAGNOSIS — E559 Vitamin D deficiency, unspecified: Secondary | ICD-10-CM | POA: Diagnosis not present

## 2022-07-19 DIAGNOSIS — N1831 Chronic kidney disease, stage 3a: Secondary | ICD-10-CM | POA: Diagnosis not present

## 2022-07-19 DIAGNOSIS — M48061 Spinal stenosis, lumbar region without neurogenic claudication: Secondary | ICD-10-CM | POA: Diagnosis not present

## 2023-07-23 DIAGNOSIS — Z0000 Encounter for general adult medical examination without abnormal findings: Secondary | ICD-10-CM | POA: Diagnosis not present

## 2023-07-23 DIAGNOSIS — Z90710 Acquired absence of both cervix and uterus: Secondary | ICD-10-CM | POA: Diagnosis not present

## 2023-07-23 DIAGNOSIS — E7800 Pure hypercholesterolemia, unspecified: Secondary | ICD-10-CM | POA: Diagnosis not present

## 2023-07-23 DIAGNOSIS — R7303 Prediabetes: Secondary | ICD-10-CM | POA: Diagnosis not present

## 2023-07-23 DIAGNOSIS — R7309 Other abnormal glucose: Secondary | ICD-10-CM | POA: Diagnosis not present

## 2023-07-23 DIAGNOSIS — K219 Gastro-esophageal reflux disease without esophagitis: Secondary | ICD-10-CM | POA: Diagnosis not present

## 2023-07-23 DIAGNOSIS — N1831 Chronic kidney disease, stage 3a: Secondary | ICD-10-CM | POA: Diagnosis not present

## 2023-07-23 DIAGNOSIS — Z23 Encounter for immunization: Secondary | ICD-10-CM | POA: Diagnosis not present

## 2023-07-23 DIAGNOSIS — I10 Essential (primary) hypertension: Secondary | ICD-10-CM | POA: Diagnosis not present

## 2023-07-23 DIAGNOSIS — H35033 Hypertensive retinopathy, bilateral: Secondary | ICD-10-CM | POA: Diagnosis not present

## 2023-07-29 DIAGNOSIS — D508 Other iron deficiency anemias: Secondary | ICD-10-CM | POA: Diagnosis not present

## 2023-08-13 DIAGNOSIS — M545 Low back pain, unspecified: Secondary | ICD-10-CM | POA: Diagnosis not present

## 2023-08-13 DIAGNOSIS — D500 Iron deficiency anemia secondary to blood loss (chronic): Secondary | ICD-10-CM | POA: Diagnosis not present

## 2023-08-13 DIAGNOSIS — K219 Gastro-esophageal reflux disease without esophagitis: Secondary | ICD-10-CM | POA: Diagnosis not present

## 2023-08-13 DIAGNOSIS — N1831 Chronic kidney disease, stage 3a: Secondary | ICD-10-CM | POA: Diagnosis not present

## 2023-08-26 DIAGNOSIS — E7800 Pure hypercholesterolemia, unspecified: Secondary | ICD-10-CM | POA: Diagnosis not present

## 2023-08-29 DIAGNOSIS — D500 Iron deficiency anemia secondary to blood loss (chronic): Secondary | ICD-10-CM | POA: Diagnosis not present

## 2023-08-30 DIAGNOSIS — D500 Iron deficiency anemia secondary to blood loss (chronic): Secondary | ICD-10-CM | POA: Diagnosis not present

## 2023-08-30 DIAGNOSIS — E7800 Pure hypercholesterolemia, unspecified: Secondary | ICD-10-CM | POA: Diagnosis not present

## 2023-09-23 DIAGNOSIS — D509 Iron deficiency anemia, unspecified: Secondary | ICD-10-CM | POA: Diagnosis not present

## 2023-10-16 DIAGNOSIS — I10 Essential (primary) hypertension: Secondary | ICD-10-CM | POA: Diagnosis not present

## 2023-10-16 DIAGNOSIS — N1831 Chronic kidney disease, stage 3a: Secondary | ICD-10-CM | POA: Diagnosis not present

## 2023-10-16 DIAGNOSIS — F411 Generalized anxiety disorder: Secondary | ICD-10-CM | POA: Diagnosis not present

## 2023-10-16 DIAGNOSIS — D509 Iron deficiency anemia, unspecified: Secondary | ICD-10-CM | POA: Diagnosis not present

## 2023-10-24 DIAGNOSIS — F4320 Adjustment disorder, unspecified: Secondary | ICD-10-CM | POA: Diagnosis not present

## 2023-10-24 DIAGNOSIS — H35033 Hypertensive retinopathy, bilateral: Secondary | ICD-10-CM | POA: Diagnosis not present

## 2023-10-24 DIAGNOSIS — I447 Left bundle-branch block, unspecified: Secondary | ICD-10-CM | POA: Diagnosis not present

## 2023-10-24 DIAGNOSIS — N1831 Chronic kidney disease, stage 3a: Secondary | ICD-10-CM | POA: Diagnosis not present

## 2023-10-24 DIAGNOSIS — K219 Gastro-esophageal reflux disease without esophagitis: Secondary | ICD-10-CM | POA: Diagnosis not present

## 2023-10-24 DIAGNOSIS — R0789 Other chest pain: Secondary | ICD-10-CM | POA: Diagnosis not present

## 2023-10-24 DIAGNOSIS — D500 Iron deficiency anemia secondary to blood loss (chronic): Secondary | ICD-10-CM | POA: Diagnosis not present

## 2023-10-24 DIAGNOSIS — I10 Essential (primary) hypertension: Secondary | ICD-10-CM | POA: Diagnosis not present

## 2023-10-25 DIAGNOSIS — D509 Iron deficiency anemia, unspecified: Secondary | ICD-10-CM | POA: Diagnosis not present

## 2023-10-25 DIAGNOSIS — Q399 Congenital malformation of esophagus, unspecified: Secondary | ICD-10-CM | POA: Diagnosis not present

## 2023-10-25 DIAGNOSIS — K317 Polyp of stomach and duodenum: Secondary | ICD-10-CM | POA: Diagnosis not present

## 2023-10-25 DIAGNOSIS — D123 Benign neoplasm of transverse colon: Secondary | ICD-10-CM | POA: Diagnosis not present

## 2023-10-25 DIAGNOSIS — K3189 Other diseases of stomach and duodenum: Secondary | ICD-10-CM | POA: Diagnosis not present

## 2023-10-25 DIAGNOSIS — K449 Diaphragmatic hernia without obstruction or gangrene: Secondary | ICD-10-CM | POA: Diagnosis not present

## 2023-10-25 DIAGNOSIS — K648 Other hemorrhoids: Secondary | ICD-10-CM | POA: Diagnosis not present

## 2023-10-25 DIAGNOSIS — K5730 Diverticulosis of large intestine without perforation or abscess without bleeding: Secondary | ICD-10-CM | POA: Diagnosis not present

## 2023-10-29 DIAGNOSIS — K317 Polyp of stomach and duodenum: Secondary | ICD-10-CM | POA: Diagnosis not present

## 2023-10-29 DIAGNOSIS — D123 Benign neoplasm of transverse colon: Secondary | ICD-10-CM | POA: Diagnosis not present

## 2023-10-29 DIAGNOSIS — K3189 Other diseases of stomach and duodenum: Secondary | ICD-10-CM | POA: Diagnosis not present

## 2023-11-13 DIAGNOSIS — N1831 Chronic kidney disease, stage 3a: Secondary | ICD-10-CM | POA: Diagnosis not present

## 2023-11-15 DIAGNOSIS — I10 Essential (primary) hypertension: Secondary | ICD-10-CM | POA: Diagnosis not present

## 2023-11-15 DIAGNOSIS — H35033 Hypertensive retinopathy, bilateral: Secondary | ICD-10-CM | POA: Diagnosis not present

## 2023-11-15 DIAGNOSIS — D509 Iron deficiency anemia, unspecified: Secondary | ICD-10-CM | POA: Diagnosis not present

## 2023-11-15 DIAGNOSIS — K5790 Diverticulosis of intestine, part unspecified, without perforation or abscess without bleeding: Secondary | ICD-10-CM | POA: Diagnosis not present

## 2023-11-15 DIAGNOSIS — N1831 Chronic kidney disease, stage 3a: Secondary | ICD-10-CM | POA: Diagnosis not present

## 2024-01-02 ENCOUNTER — Ambulatory Visit: Payer: Medicare PPO | Attending: Cardiology | Admitting: Cardiology

## 2024-01-02 ENCOUNTER — Encounter: Payer: Self-pay | Admitting: Cardiology

## 2024-01-02 VITALS — BP 118/62 | HR 84 | Ht 62.0 in | Wt 178.0 lb

## 2024-01-02 DIAGNOSIS — R0789 Other chest pain: Secondary | ICD-10-CM

## 2024-01-02 DIAGNOSIS — I447 Left bundle-branch block, unspecified: Secondary | ICD-10-CM | POA: Diagnosis not present

## 2024-01-02 DIAGNOSIS — I10 Essential (primary) hypertension: Secondary | ICD-10-CM | POA: Diagnosis not present

## 2024-01-02 DIAGNOSIS — K2100 Gastro-esophageal reflux disease with esophagitis, without bleeding: Secondary | ICD-10-CM | POA: Diagnosis not present

## 2024-01-02 NOTE — Patient Instructions (Signed)
 Medication Instructions:  The current medical regimen is effective;  continue present plan and medications.  *If you need a refill on your cardiac medications before your next appointment, please call your pharmacy*   Follow-Up: At [facility], you and your health needs are our priority.  As part of our continuing mission to provide you with exceptional heart care, our providers are all part of one team.  This team includes your primary Cardiologist (physician) and Advanced Practice Providers or APPs (Physician Assistants and Nurse Practitioners) who all work together to provide you with the care you need, when you need it.  Your next appointment:   Follow up as needed.     1st Floor: - Lobby - Registration  - Pharmacy  - Lab - Cafe  2nd Floor: - PV Lab - Diagnostic Testing (echo, CT, nuclear med)  3rd Floor: - Vacant  4th Floor: - TCTS (cardiothoracic surgery) - AFib Clinic - Structural Heart Clinic - Vascular Surgery  - Vascular Ultrasound  5th Floor: - HeartCare Cardiology (general and EP) - Clinical Pharmacy for coumadin, hypertension, lipid, weight-loss medications, and med management appointments    Valet parking services will be available as well.

## 2024-01-02 NOTE — Progress Notes (Signed)
 Cardiology Office Note:  .   Date:  01/02/2024  ID:  Lodema Rimes, DOB [birth year], MRN [id number] PCP: Neda Balk, MD  [facility] Providers Cardiologist:  Dorothye Gathers, MD     History of Present Illness: .   Claire Jensen is a 89 y.o. female Discussed the use of AI scribe software for clinical note transcription with the patient, who gave verbal consent to proceed.  History of Present Illness Claire Jensen is an 89 year old female with hypertension and left bundle branch block who presents with chest pain. She was referred by Dr. Marily Shows for evaluation of chest pain and due to her family history of heart disease.  She experiences chest pressure and a burning sensation, described as 'being on fire', which has been attributed to acid reflux. These symptoms have improved significantly with the use of Nexium, which she takes daily and describes as a 'miracle' for her symptoms.  Her hypertension was previously uncontrolled despite being on lisinopril 40 mg daily, split between morning and night. Her blood pressure has improved significantly with the addition of amlodipine 5 mg daily.  She has a history of left bundle branch block, discovered years ago, and has undergone a stress test in the past, which she completed successfully. She has not experienced episodes of fainting or shortness of breath.  She underwent a colonoscopy and upper endoscopy on October 25, 2023, which showed no bleeding, despite a drop in hemoglobin to 8. No active bleeding was found during the procedures.  Her family history is significant for heart disease; her brother died at 42 from a heart attack, her father died at 15 and was a smoker, and her mother died at 36 from heart failure and had a history of high blood pressure.      ROS: No syncope  Studies Reviewed: Aaron Aas   EKG Interpretation Date/Time:  Thursday January 02 2024 13:31:43 EDT Ventricular Rate:  84 PR Interval:  166 QRS Duration:  122 QT  Interval:  380 QTC Calculation: 449 R Axis:   -52  Text Interpretation: Normal sinus rhythm Left bundle branch block Minimal voltage criteria for LVH, may be normal variant ( [employer] product ) When compared with ECG of 07-Feb-2017 12:36, No significant change since last tracing Confirmed by Dorothye Gathers ([id number]) on 01/02/2024 1:51:09 PM    Results LABS High sensitivity troponin: 16 Hb: 8 LDL: 73  DIAGNOSTIC EKG: Left bundle branch block Stress test: Normal  PROCEDURE NOTES Colonoscopy: No bleeding (10/25/2023) Upper endoscopy: No bleeding (10/25/2023) Risk Assessment/Calculations:            Physical Exam:   VS:  BP 118/62   Pulse 84   Ht 5\' 2"  (1.575 m)   Wt 178 lb (80.7 kg)   SpO2 96%   BMI 32.56 kg/m    Wt Readings from Last 3 Encounters:  01/02/24 178 lb (80.7 kg)  10/08/17 167 lb 2 oz (75.8 kg)  09/24/17 164 lb (74.4 kg)    GEN: Well nourished, well developed in no acute distress NECK: No JVD; No carotid bruits CARDIAC: RRR, no murmurs, no rubs, no gallops RESPIRATORY:  Clear to auscultation without rales, wheezing or rhonchi  ABDOMEN: Soft, non-tender, non-distended EXTREMITIES:  No edema; No deformity   ASSESSMENT AND PLAN: .    Assessment and Plan Assessment & Plan Left bundle branch block Chronic left bundle branch block with previous normal stress test in [place] . No syncope or dyspnea. Explained as an electrical conduction delay, currently not  requiring a pacemaker. Consider repeat stress test if symptoms like chest pain or unusual sensations return.  Hypertension Hypertension well-controlled with amlodipine 5 mg daily and lisinopril 20 mg daily. Reports significant improvement in blood pressure control.  Chronic kidney disease, stage 3a Stage 3 chronic kidney disease, no specific discussion in this encounter.  Anemia Anemia with hemoglobin drop to 8. Recent colonoscopy and upper endoscopy on February 14 showed no active bleeding. Likely related to  GERD-related esophageal irritation.  Gastroesophageal reflux disease (GERD) GERD with previous chest pressure and burning sensation, significantly improved with daily Nexium. Reports Nexium as effective. Recent endoscopy showed pressure on the muscle between the stomach and esophagus, allowing acid reflux. - Continue Nexium daily. - Advise to report any recurrence of chest pain or GERD symptoms.        Signed, Dorothye Gathers, MD

## 2024-02-17 DIAGNOSIS — N1831 Chronic kidney disease, stage 3a: Secondary | ICD-10-CM | POA: Diagnosis not present

## 2024-02-17 DIAGNOSIS — E7800 Pure hypercholesterolemia, unspecified: Secondary | ICD-10-CM | POA: Diagnosis not present

## 2024-02-17 DIAGNOSIS — I10 Essential (primary) hypertension: Secondary | ICD-10-CM | POA: Diagnosis not present

## 2024-08-13 DIAGNOSIS — E7800 Pure hypercholesterolemia, unspecified: Secondary | ICD-10-CM | POA: Diagnosis not present

## 2024-08-13 DIAGNOSIS — Z23 Encounter for immunization: Secondary | ICD-10-CM | POA: Diagnosis not present

## 2024-08-13 DIAGNOSIS — Z1389 Encounter for screening for other disorder: Secondary | ICD-10-CM | POA: Diagnosis not present

## 2024-08-13 DIAGNOSIS — Z0000 Encounter for general adult medical examination without abnormal findings: Secondary | ICD-10-CM | POA: Diagnosis not present

## 2024-08-13 DIAGNOSIS — I10 Essential (primary) hypertension: Secondary | ICD-10-CM | POA: Diagnosis not present
